# Patient Record
Sex: Female | Born: 1975
Health system: Southern US, Community
[De-identification: ages and names within clinical notes are randomized; demographics above are authoritative.]

## PROBLEM LIST (undated history)

## (undated) DIAGNOSIS — D259 Leiomyoma of uterus, unspecified: Secondary | ICD-10-CM

## (undated) DIAGNOSIS — K5900 Constipation, unspecified: Secondary | ICD-10-CM

## (undated) DIAGNOSIS — J45909 Unspecified asthma, uncomplicated: Secondary | ICD-10-CM

## (undated) DIAGNOSIS — E079 Disorder of thyroid, unspecified: Secondary | ICD-10-CM

## (undated) DIAGNOSIS — K2970 Gastritis, unspecified, without bleeding: Secondary | ICD-10-CM

## (undated) HISTORY — DX: Leiomyoma of uterus, unspecified: D25.9

## (undated) HISTORY — PX: BREAST EXCISIONAL BIOPSY: SUR124

## (undated) HISTORY — DX: Gastritis, unspecified, without bleeding: K29.70

## (undated) HISTORY — DX: Unspecified asthma, uncomplicated: J45.909

## (undated) HISTORY — DX: Disorder of thyroid, unspecified: E07.9

## (undated) HISTORY — PX: VEIN SURGERY: SHX48

## (undated) HISTORY — DX: Constipation, unspecified: K59.00

---

## 2006-07-14 HISTORY — PX: BREAST CYST ASPIRATION: SHX578

## 2010-07-14 DIAGNOSIS — D259 Leiomyoma of uterus, unspecified: Secondary | ICD-10-CM

## 2010-07-14 HISTORY — DX: Leiomyoma of uterus, unspecified: D25.9

## 2012-12-28 ENCOUNTER — Other Ambulatory Visit: Payer: Self-pay | Admitting: Obstetrics and Gynecology

## 2012-12-28 DIAGNOSIS — N6310 Unspecified lump in the right breast, unspecified quadrant: Secondary | ICD-10-CM

## 2012-12-28 DIAGNOSIS — Z803 Family history of malignant neoplasm of breast: Secondary | ICD-10-CM

## 2014-02-20 ENCOUNTER — Ambulatory Visit: Payer: Self-pay | Attending: Internal Medicine

## 2014-04-17 ENCOUNTER — Ambulatory Visit: Payer: Self-pay | Admitting: Family Medicine

## 2014-04-24 ENCOUNTER — Other Ambulatory Visit: Payer: Self-pay | Admitting: Family Medicine

## 2014-04-24 ENCOUNTER — Encounter: Payer: Self-pay | Admitting: Family Medicine

## 2014-04-24 ENCOUNTER — Ambulatory Visit: Payer: Self-pay | Attending: Family Medicine | Admitting: Family Medicine

## 2014-04-24 VITALS — BP 109/73 | HR 74 | Temp 97.9°F | Resp 16 | Ht 69.29 in | Wt 150.0 lb

## 2014-04-24 DIAGNOSIS — R7989 Other specified abnormal findings of blood chemistry: Secondary | ICD-10-CM

## 2014-04-24 DIAGNOSIS — R946 Abnormal results of thyroid function studies: Secondary | ICD-10-CM | POA: Insufficient documentation

## 2014-04-24 DIAGNOSIS — E0500 Thyrotoxicosis with diffuse goiter without thyrotoxic crisis or storm: Secondary | ICD-10-CM | POA: Insufficient documentation

## 2014-04-24 DIAGNOSIS — N951 Menopausal and female climacteric states: Secondary | ICD-10-CM

## 2014-04-24 DIAGNOSIS — Z720 Tobacco use: Secondary | ICD-10-CM

## 2014-04-24 DIAGNOSIS — N941 Dyspareunia: Secondary | ICD-10-CM | POA: Insufficient documentation

## 2014-04-24 DIAGNOSIS — F17200 Nicotine dependence, unspecified, uncomplicated: Secondary | ICD-10-CM | POA: Insufficient documentation

## 2014-04-24 DIAGNOSIS — N926 Irregular menstruation, unspecified: Secondary | ICD-10-CM

## 2014-04-24 DIAGNOSIS — F17210 Nicotine dependence, cigarettes, uncomplicated: Secondary | ICD-10-CM | POA: Insufficient documentation

## 2014-04-24 LAB — CBC
HCT: 37.4 % (ref 36.0–46.0)
Hemoglobin: 12.7 g/dL (ref 12.0–15.0)
MCH: 30.2 pg (ref 26.0–34.0)
MCHC: 34 g/dL (ref 30.0–36.0)
MCV: 89 fL (ref 78.0–100.0)
PLATELETS: 261 10*3/uL (ref 150–400)
RBC: 4.2 MIL/uL (ref 3.87–5.11)
RDW: 13 % (ref 11.5–15.5)
WBC: 6 10*3/uL (ref 4.0–10.5)

## 2014-04-24 LAB — COMPLETE METABOLIC PANEL WITH GFR
ALBUMIN: 4.1 g/dL (ref 3.5–5.2)
ALT: 11 U/L (ref 0–35)
AST: 13 U/L (ref 0–37)
Alkaline Phosphatase: 52 U/L (ref 39–117)
BUN: 11 mg/dL (ref 6–23)
CALCIUM: 9.1 mg/dL (ref 8.4–10.5)
CHLORIDE: 106 meq/L (ref 96–112)
CO2: 24 mEq/L (ref 19–32)
Creat: 0.47 mg/dL — ABNORMAL LOW (ref 0.50–1.10)
GFR, Est African American: >89 mL/min
GFR, Est Non African American: >89 mL/min
Glucose, Bld: 90 mg/dL (ref 70–99)
POTASSIUM: 4.1 meq/L (ref 3.5–5.3)
Sodium: 140 mEq/L (ref 135–145)
Total Bilirubin: 0.3 mg/dL (ref 0.2–1.2)
Total Protein: 6.1 g/dL (ref 6.0–8.3)

## 2014-04-24 LAB — POCT URINE PREGNANCY: PREG TEST UR: NEGATIVE

## 2014-04-24 LAB — TSH: TSH: <0.008 u[IU]/mL — ABNORMAL LOW (ref 0.350–4.500)

## 2014-04-24 NOTE — Progress Notes (Signed)
   Subjective:    Patient ID: Yvette Johnson, female    DOB: [birth year], 38 y.o.   MRN: [id number] CC: establish care, pain with intercourse, irregular menses  HPI 38 yo F presents to establish care and discuss the following:  1. Hot flashes: one year of hot flashes, palpitations. Worsening over past 6 months. Also with dizziness. Symptoms occur 2 weeks before her period. Symptoms improve after period. Periods are not heavy. Periods occur every 28-35 days. At max up to 40 days. Night sweats. Feeling both anxiety and depression related to the symptoms. Symptoms, especially irritability, are affecting home and work.   Soc hx; current smoker  Review of Systems As per HPI    Objective:   Physical Exam BP 109/73  Pulse 74  Temp(Src) 97.9 F (36.6 C) (Oral)  Resp 16  Ht 5' 9.29" (1.76 m)  Wt 150 lb (68.04 kg)  BMI 21.97 kg/m2  SpO2 100%  LMP 03/25/2014 General appearance: alert, cooperative and no distress Neck: no adenopathy, supple, symmetrical, trachea midline and thyroid not enlarged, symmetric, no tenderness/mass/nodules Lungs: clear to auscultation bilaterally Heart: regular rate and rhythm, S1, S2 normal, no murmur, click, rub or gallop Extremities: extremities normal, atraumatic, no cyanosis or edema and varicose veins noted   U preg negative      Assessment & Plan:

## 2014-04-24 NOTE — Progress Notes (Signed)
Establish Care Pt complaining of pain with intercourse. Stated menses is irregular.

## 2014-04-24 NOTE — Assessment & Plan Note (Addendum)
A; normal exam. Symptoms of early menopause P: Lab work, TSH, FSH/LH, CBC, CMP Plan for lab results review and pap at f/u visit.   Reviewed labs LH and FSH normal, low TSH suspect symptoms due to hyperthyroidism will obtain f/u free T4 and T3

## 2014-04-24 NOTE — Assessment & Plan Note (Signed)
A; not ready to quit P: smoking cessation recommended

## 2014-04-24 NOTE — Patient Instructions (Signed)
Yvette Johnson,  Thank you for coming in today. It was nice to meet you.   You will be called with lab results. Checking for early menopause.  F/u in 2-3 weeks to review labs and for physical with pap smear.  Dr. Adrian Blackwater   Nicotine chewing gum Qu es este medicamento? LA NICOTINA ayuda a los fumadores a dejar de fumar. Este medicamento reemplaza la nicotina de los cigarrillos y [place] a disminuir los efectos de la abstinencia. Es ms eficaz si se lo combina con un programa supervisado para dejar de fumar. Este medicamento puede ser utilizado para otros usos; si tiene alguna pregunta consulte con su proveedor de atencin mdica o con su farmacutico. MARCAS COMERCIALES DISPONIBLES: NICOrelief, Nicorette Qu le debo informar a mi profesional de la salud antes de tomar este medicamento? Necesita saber si usted presenta alguno de los [employer] o situaciones: -diabetes -enfermedad cardiaca, angina, latidos cardiacos irregulares o ataque cardiaco previo -enfermedad pulmonar incluyendo asma -hipertiroidismo -feocromocitoma -lceras o problemas estomacales -una reaccin alrgica o inusual a la nicotina, a otros medicamentos, alimentos, colorantes o conservantes -si est embarazada o buscando quedar embarazada -si est amamantando a un beb Cmo debo utilizar este medicamento? El chicle se debe masticar, pero no lo trague. Siga atentamente las indicaciones que acompaan al chicle. selo exactamente como se indica. Cuando sienta un deseo urgente de fumar, mastique un chicle lentamente. Contine masticando hasta que sienta el sabor del chicle o hasta que sienta un ligero hormigueo en la boca. En ese momento, deje de Health visitor y colquelo entre la mejilla y las encas. Espere hasta que el sabor del chicle o el hormigueo casi hayan desaparecido y comience a Engineer, manufacturing systems nuevamente. Contine masticando de esta manera durante aproximadamente 30 minutos. Masticar lentamente ayudar a  reducir las ansias y tambin ayudar a reducir la posibilidad de Best boy acidez de [place] u otros efectos secundarios gastrointestinales. Hable con su pediatra para informarse acerca del uso de este medicamento en nios. Puede requerir atencin especial. Sobredosis: Pngase en contacto inmediatamente con un centro toxicolgico o una sala de urgencia si usted cree que haya tomado demasiado medicamento. ATENCIN: [employer] es solo para usted. No comparta este medicamento con nadie. Qu sucede si me olvido de una dosis? No se aplica en este caso. Slo debe utilizar el chicle cuando sienta un fuerte deseo de fumar. No mastique ms de un chicle por vez. Qu puede interactuar con este medicamento? -medicamentos para el asma -medicamentos para la presin sangunea -medicamentos la depresin mental Puede ser que esta lista no menciona todas las posibles interacciones. Informe a su profesional de [employer]	[place] de [employer] productos a base de hierbas, medicamentos de [place] o suplementos nutritivos que est tomando. Si usted fuma, consume bebidas alcohlicas o si utiliza drogas ilegales, indqueselo tambin a su profesional de [employer]	[place]. Algunas sustancias pueden interactuar con su medicamento. A qu debo estar atento al usar [employer]? Lleve siempre los chicles de nicotina consigo. No utilice ms de 30 chicles por da. Su utilizacin excesiva puede aumentar el riesgo de una sobredosis. A medida que disminuya la necesidad de fumar, reduzca gradualmente la cantidad de chicles que mastica por da en el transcurso de un perodo de 2 a 3 meses. Cuando llegue a Engineer, manufacturing systems slo 1  2 chicles por da, deje de usar los chicles de nicotina. Usted debe comenzar a usar los chicles de nicotina el da que deje de fumar. Est bien si no tienes xito con su intento de dejar  de fumar y fumas un cigarrillo. Usted puede seguir su intento de abandono y seguir usando el producto segn las indicaciones. Simplemente  deseche sus cigarrillos y regrese a su plan de dejar de fumar. Si le duele la boca cuando mastica el chicle, chupe caramelos duros sin azcar entre chicle y chicle para Best boy. [place] sus dientes en forma peridica para reducir la irritacin de la boca. Si [place] dentadura postiza y Advice worker se le pega, consulte a su mdico o a su profesional de Technical sales engineer. Si usted es diabtico y deja de Copy, los efectos de la insulina pueden aumentar; por esta razn, puede resultar necesario que reduzca su dosis de insulina. Consulte con su mdico o con su profesional de la salud sobre cmo ajustar su dosis de [place]. Qu efectos secundarios puedo tener al [employer] este medicamento? Efectos secundarios que debe informar a su mdico o a Barrister's clerk de la salud tan pronto como sea posible: -Chief of Staff como erupcin cutnea, picazn o urticarias, hinchazn de la cara, labios o lengua -ampollas en la boca -problemas respiratorios -cambios de audicin -cambios en la visin -dolor en el pecho -sudoracin fra -confusin -pulso cardiaco rpido, irregular -sensacin de desmayos o aturdimiento, cadas -dolor de cabeza -mayor salivacin -nuseas, vmitos -dolor estomacal -debilidad Efectos secundarios que, por lo general, no requieren atencin mdica (debe informarlos a su mdico o a su profesional de la salud si persisten o si son molestos): -diarrea -boca seca -hipos -irritabilidad -inquietud o nerviosismo -dificultad para conciliar el sueo o sueos vvidos Puede ser que esta lista no menciona todos los posibles efectos secundarios. Comunquese a su mdico por asesoramiento mdico [employer]. Usted puede informar los efectos secundarios a la FDA por telfono al 1-800-FDA-1088. Dnde debo guardar mi medicina? Mantngala fuera del alcance de los nios. [place] a [employer], entre 15 y 83 grados C (59 y 42 grados F). Protjala del calor y la luz. Deseche  todo el medicamento que no haya utilizado, despus de la fecha de vencimiento. ATENCIN: Este folleto es un resumen. Puede ser que no cubra toda la posible informacin. Si usted tiene preguntas acerca de esta medicina, consulte con su mdico, su farmacutico o su profesional de Technical sales engineer.  2015, Elsevier/Gold Standard. (2013-08-16 15:37:48)

## 2014-04-25 ENCOUNTER — Telehealth: Payer: Self-pay | Admitting: *Deleted

## 2014-04-25 LAB — FSH/LH
FSH: 32.8 m[IU]/mL
LH: 48.3 m[IU]/mL

## 2014-04-25 LAB — VITAMIN D 25 HYDROXY (VIT D DEFICIENCY, FRACTURES): VIT D 25 HYDROXY: 49 ng/mL (ref 30–89)

## 2014-04-25 NOTE — Telephone Encounter (Signed)
Pt aware of lab results. Notified lab was added.       Notes Recorded by Minerva Ends, MD on 04/25/2014 at 4:34 PM Normal TSH and FSH Normal vitamin D Normal CBC and CMP. Low TSH concerning for hyperthyroidism, add on T3 and free T4 if able if not patient will have to come back for labs.

## 2014-04-26 LAB — T3, FREE: T3 FREE: 4.8 pg/mL — AB (ref 2.3–4.2)

## 2014-04-26 LAB — T4, FREE: FREE T4: 1.47 ng/dL (ref 0.80–1.80)

## 2014-04-26 NOTE — Assessment & Plan Note (Addendum)
Elevated free T3, normal free T4. F/u labs needed T3 and lipids.  I am unable to order TRAb (thyroid receptor antibody) to help figure out the cause of hyperthyroidism, so I placed a referral to endocrinology.  Patient should come back within 1 week for blood draw and schedule f/u with me in 2 weeks

## 2014-04-26 NOTE — Addendum Note (Signed)
Addended by: Boykin Nearing on: 04/26/2014 01:56 PM   Modules accepted: Orders

## 2014-04-28 ENCOUNTER — Telehealth: Payer: Self-pay | Admitting: *Deleted

## 2014-04-28 NOTE — Telephone Encounter (Deleted)
Message copied by Betti Cruz on Fri Apr 28, 2014 10:51 AM ------      Message from: Boykin Nearing      Created: Wed Apr 26, 2014  1:56 PM       Elevated free T3, normal free T4.      F/u labs needed T3 and lipids.       I am unable to order TRAb (thyroid receptor antibody) to help figure out the cause of hyperthyroidism, so I placed a referral to endocrinology.       Patient should come back within 1 week for blood draw and schedule f/u with me in 2 weeks ------

## 2014-04-28 NOTE — Telephone Encounter (Signed)
Pt aware of lab results, will make apt for next week

## 2014-04-28 NOTE — Telephone Encounter (Signed)
Message copied by Betti Cruz on Fri Apr 28, 2014 10:55 AM ------      Message from: Boykin Nearing      Created: Wed Apr 26, 2014  1:56 PM       Elevated free T3, normal free T4.      F/u labs needed T3 and lipids.       I am unable to order TRAb (thyroid receptor antibody) to help figure out the cause of hyperthyroidism, so I placed a referral to endocrinology.       Patient should come back within 1 week for blood draw and schedule f/u with me in 2 weeks ------

## 2014-05-03 ENCOUNTER — Ambulatory Visit: Payer: Self-pay | Attending: Family Medicine

## 2014-05-03 DIAGNOSIS — N951 Menopausal and female climacteric states: Secondary | ICD-10-CM | POA: Insufficient documentation

## 2014-05-03 DIAGNOSIS — R946 Abnormal results of thyroid function studies: Secondary | ICD-10-CM | POA: Insufficient documentation

## 2014-05-03 DIAGNOSIS — R7989 Other specified abnormal findings of blood chemistry: Secondary | ICD-10-CM

## 2014-05-03 LAB — LIPID PANEL
Cholesterol: 146 mg/dL (ref 0–200)
HDL: 50 mg/dL (ref >39)
LDL CALC: 76 mg/dL (ref 0–99)
Total CHOL/HDL Ratio: 2.9 Ratio
Triglycerides: 99 mg/dL (ref <150)
VLDL: 20 mg/dL (ref 0–40)

## 2014-05-03 LAB — T3
T3 TOTAL: 174.3 ng/dL (ref 80.0–204.0)
T3 TOTAL: 174.5 ng/dL (ref 80.0–204.0)

## 2014-05-03 LAB — T4, FREE: Free T4: 1.4 ng/dL (ref 0.80–1.80)

## 2014-05-08 ENCOUNTER — Ambulatory Visit: Payer: Self-pay | Attending: Family Medicine | Admitting: Family Medicine

## 2014-05-08 ENCOUNTER — Encounter: Payer: Self-pay | Admitting: Family Medicine

## 2014-05-08 ENCOUNTER — Other Ambulatory Visit (HOSPITAL_COMMUNITY)
Admission: RE | Admit: 2014-05-08 | Discharge: 2014-05-08 | Disposition: A | Discharge status: Home / Self Care | Payer: Self-pay | Source: Ambulatory Visit | Attending: Family Medicine | Admitting: Family Medicine

## 2014-05-08 VITALS — BP 96/60 | HR 70 | Temp 97.7°F | Resp 19 | Ht 69.0 in | Wt 154.0 lb

## 2014-05-08 DIAGNOSIS — E0590 Thyrotoxicosis, unspecified without thyrotoxic crisis or storm: Secondary | ICD-10-CM | POA: Insufficient documentation

## 2014-05-08 DIAGNOSIS — Z01419 Encounter for gynecological examination (general) (routine) without abnormal findings: Secondary | ICD-10-CM | POA: Insufficient documentation

## 2014-05-08 DIAGNOSIS — B373 Candidiasis of vulva and vagina: Secondary | ICD-10-CM | POA: Insufficient documentation

## 2014-05-08 DIAGNOSIS — Z124 Encounter for screening for malignant neoplasm of cervix: Secondary | ICD-10-CM

## 2014-05-08 DIAGNOSIS — Z1151 Encounter for screening for human papillomavirus (HPV): Secondary | ICD-10-CM | POA: Insufficient documentation

## 2014-05-08 DIAGNOSIS — Z113 Encounter for screening for infections with a predominantly sexual mode of transmission: Secondary | ICD-10-CM | POA: Insufficient documentation

## 2014-05-08 DIAGNOSIS — N760 Acute vaginitis: Secondary | ICD-10-CM | POA: Insufficient documentation

## 2014-05-08 DIAGNOSIS — B3731 Acute candidiasis of vulva and vagina: Secondary | ICD-10-CM

## 2014-05-08 DIAGNOSIS — F17210 Nicotine dependence, cigarettes, uncomplicated: Secondary | ICD-10-CM | POA: Insufficient documentation

## 2014-05-08 LAB — CERVICOVAGINAL ANCILLARY ONLY
WET PREP (BD AFFIRM): NEGATIVE
Wet Prep (BD Affirm): NEGATIVE
Wet Prep (BD Affirm): POSITIVE — AB

## 2014-05-08 NOTE — Assessment & Plan Note (Signed)
A: symptoms and exam stable P: Go forward with endocrinology visit.  ? If treatment would be recommended for subclinical hyperthyroidism with symptoms.

## 2014-05-08 NOTE — Progress Notes (Signed)
Annual Physical and pap smear

## 2014-05-08 NOTE — Assessment & Plan Note (Signed)
A: normal exam P: Screening pap, Gc.chlam, wet prep done today Patient advised to call in if she still has R pelvic pain

## 2014-05-08 NOTE — Progress Notes (Signed)
   Subjective:    Patient ID: Yvette Johnson, female    DOB: [birth year], 38 y.o.   MRN: [id number] CC: gynecological exam  HPI 38 yo F presents for f/u visit:  1. Pap smear: currently on menses. Having some cramps. Outside of menses does have some intermittent R pelvic pains that are moderate to severe. Non radiating. No associated fever or vaginal discharge. No pain currently.   2. Subclinical hyperthyroidism: low TSH. Normal free T4 and total T3. Patient still with intermittent hot flashes, palpitations. Irritable mood. Has appt with endocrinology next month.   Soc hx: current light smoker  Review of Systems As per HPI     Objective:   Physical Exam BP 96/60  Pulse 70  Temp(Src) 97.7 F (36.5 C) (Oral)  Resp 19  Ht 5\' 9"  (1.753 m)  Wt 154 lb (69.854 kg)  BMI 22.73 kg/m2  SpO2 98%  LMP 05/08/2014 General appearance: alert, cooperative and no distress Neck: thyroid not enlarged, symmetric, no tenderness/mass/nodules Pelvic: cervix normal in appearance, external genitalia normal, no adnexal masses or tenderness, no cervical motion tenderness, positive findings: vaginal discharge:  bloody, rectovaginal septum normal and uterus normal size, shape, and consistency      Assessment & Plan:

## 2014-05-08 NOTE — Patient Instructions (Signed)
Yvette Johnson,  Thank you for coming back today. You will be called with pap results.   Keep endocrinology referral to discuss subclinical hyperthyroidism with symptoms.   Call my office if you are still getting R pelvic pains after your periods.   Dr. Adrian Blackwater

## 2014-05-09 DIAGNOSIS — B373 Candidiasis of vulva and vagina: Secondary | ICD-10-CM | POA: Insufficient documentation

## 2014-05-09 DIAGNOSIS — B3731 Acute candidiasis of vulva and vagina: Secondary | ICD-10-CM | POA: Insufficient documentation

## 2014-05-09 LAB — CERVICOVAGINAL ANCILLARY ONLY
Chlamydia: NEGATIVE
NEISSERIA GONORRHEA: NEGATIVE

## 2014-05-09 LAB — CYTOLOGY - PAP

## 2014-05-09 MED ORDER — FLUCONAZOLE 150 MG PO TABS
150.0000 mg | ORAL_TABLET | ORAL | Status: DC
Start: 2014-05-09 — End: 2015-07-10

## 2014-05-09 NOTE — Addendum Note (Signed)
Addended by: Boykin Nearing on: 05/09/2014 09:36 AM   Modules accepted: Orders

## 2014-05-09 NOTE — Assessment & Plan Note (Signed)
Yeast on wet prep, sent in flagyl

## 2014-05-11 ENCOUNTER — Telehealth: Payer: Self-pay | Admitting: *Deleted

## 2014-05-11 NOTE — Telephone Encounter (Signed)
Message copied by Betti Cruz on Thu May 11, 2014 11:47 AM ------      Message from: Boykin Nearing      Created: Tue May 09, 2014  9:35 AM       Yeast on wet prep, sent in flagyl ------

## 2014-05-11 NOTE — Telephone Encounter (Signed)
Pt aware of lab, pap and wet prep results, will pick up Rx Flagyl tomorrow

## 2014-05-15 ENCOUNTER — Encounter: Payer: Self-pay | Admitting: Family Medicine

## 2014-05-22 ENCOUNTER — Ambulatory Visit: Payer: Self-pay | Attending: Family Medicine

## 2014-05-29 ENCOUNTER — Ambulatory Visit: Payer: Self-pay | Admitting: Internal Medicine

## 2014-06-15 ENCOUNTER — Ambulatory Visit: Payer: Self-pay | Admitting: Internal Medicine

## 2014-06-30 ENCOUNTER — Ambulatory Visit (INDEPENDENT_AMBULATORY_CARE_PROVIDER_SITE_OTHER): Payer: Self-pay | Admitting: Internal Medicine

## 2014-06-30 ENCOUNTER — Encounter: Payer: Self-pay | Admitting: Internal Medicine

## 2014-06-30 VITALS — BP 110/58 | HR 81 | Temp 98.5°F | Resp 12 | Ht 65.5 in | Wt 152.0 lb

## 2014-06-30 DIAGNOSIS — E0590 Thyrotoxicosis, unspecified without thyrotoxic crisis or storm: Secondary | ICD-10-CM

## 2014-06-30 NOTE — Progress Notes (Addendum)
Patient ID: Yvette Johnson, female   DOB: 05/04/1976, 38 y.o.   MRN: [id number]   HPI  Yvette Johnson is a 38 y.o.-year-old female, referred by her PCP, Dr. Adrian Blackwater, for evaluation for thyrotoxicosis.  She presented to see her PCP for high heart rate and pain in pelvis. TFTs were found to be abnormal:  I reviewed pt's thyroid tests: Lab Results  Component Value Date   TSH <0.008* 04/24/2014   FREET4 1.40 05/03/2014   FREET4 1.47 04/24/2014    Pt denies feeling nodules in neck, hoarseness, + dysphagia last month, not anymore, occasional choking/no odynophagia, no SOB with lying down.  She c/o the following sx for the last year: - + fatigue - + weight loss (intentional - lost 4 lbs in 2 mo) - no excessive sweating/heat intolerance - no tremors - + anxiety (new) - + palpitations - + rapid heart beat - + dizziness - no hyperdefecation  Pt does have a FH of thyroid ds: mother, aunts (hyper- and hypo-thyroidism). + FH of thyroid cancer in aunt. No h/o radiation tx to head or neck.  No seaweed or kelp, no recent contrast studies. No steroid use. No herbal supplements.   No other PMH, other than ovarian and vaginal pain.  ROS: Constitutional: + weight gain/loss, + fatigue, + hot flushes/cold intol., + poor sleep, + nocturia Eyes: + blurry vision, no xerophthalmia ENT: see HPI, + tinnitus, + hypoacusis Cardiovascular: + CP/+ SOB/+ palpitations/+ leg swelling Respiratory:+ cough/+ SOB Gastrointestinal: no N/V/D/C, + heartburn Musculoskeletal: no muscle aches/+ joint aches Skin: no rashes, + itching, + easy bruising, + hair loss Neurological: no tremors/numbness/tingling/dizziness, + HA Psychiatric: + both: depression/anxiety   Past Medical History  Diagnosis Date  . Uterine fibroid 2012    Past Surgical History  Procedure Laterality Date  . Cesarean section  2010   . Breast cyst aspiration Right 2008    History   Social History  . Marital Status: Married    Spouse Name:  N/A    Number of Children: 1  . Years of Education: 12    Occupational History  . Hair Stylist     Social History Main Topics  . Smoking status: Current Every Day Smoker - now 3 cigs a day-- 0.25 packs/day for 23 years    Types: Cigarettes  . Smokeless tobacco: Never Used  . Alcohol Use: No  . Drug Use: No  . Sexual Activity: Yes    Birth Control/ Protection: None   Social History Narrative   Lives with husband, daughter and mother.    Current Outpatient Prescriptions on File Prior to Visit  Medication Sig Dispense Refill  . Soy Isoflavone 100 MG CAPS Take by mouth.    . fluconazole (DIFLUCAN) 150 MG tablet Take 1 tablet (150 mg total) by mouth every 3 (three) days. (Patient not taking: Reported on 06/30/2014) 2 tablet 0   No current facility-administered medications on file prior to visit.   Allergies  Allergen Reactions  . Antihistamines, Diphenhydramine-Type Shortness Of Breath  . Penicillins Nausea And Vomiting  . Vicks Dayquil Cold & Flu [Dm-Phenylephrine-Acetaminophen] Shortness Of Breath   Family History  Problem Relation Age of Onset  . Cancer Mother 44    breast cancer, 2009   . Asthma Father   . Heart disease Father 51  . Diabetes Neg Hx    PE: BP 110/58 mmHg  Pulse 81  Temp(Src) 98.5 F (36.9 C) (Oral)  Resp 12  Ht 5' 5.5" (1.664 m)  [facility]  152 lb (68.947 kg)  BMI 24.90 kg/m2  SpO2 97% Wt Readings from Last 3 Encounters:  06/30/14 152 lb (68.947 kg)  05/08/14 154 lb (69.854 kg)  04/24/14 150 lb (68.04 kg)   Constitutional: normal weight, in NAD Eyes: PERRLA, EOMI, no exophthalmos, no lid lag, no stare ENT: moist mucous membranes, no thyromegaly, no thyroid bruits, no cervical lymphadenopathy Cardiovascular: RRR, No MRG Respiratory: CTA B Gastrointestinal: abdomen soft, NT, ND, BS+ Musculoskeletal: no deformities, strength intact in all 4 Skin: moist, warm, no rashes Neurological: no tremor with outstretched hands, DTR normal in all  4  ASSESSMENT: 1. Thyrotoxicosis  PLAN:  1. Patient with a recently found low TSH, but with some thyrotoxic sxs: mild weight loss, palpitations, anxiety, for the last year - she does not appear to have exogenous causes for the low TSH.  - We discussed that possible causes of thyrotoxicosis are:  Graves ds   Thyroiditis toxic multinodular goiter/ toxic adenoma (I cannot feel nodules at palpation of her thyroid). - I suggested that we check the TSH, fT3 and fT4 and also had thyroid stimulating antibodies to screen for Graves' disease.  - If the tests remain abnormal, ideally, we would check an uptake and scan to differentiate between the 3 above possible etiologies, however, she does not have insurance and this can be quite costly. Will try to bypass the test, if possible. My suspicion is for mild Graves ds. due to the protracted course. - we discussed about possible modalities of treatment for the above conditions, to include methimazole use, radioactive iodine ablation or (last resort) surgery. Discussed plusses and minuses of each. Again, RAI tx would be costly, so we need to try to stay with MMI if possible. Also, she has a 75 y/o child so RAI tx would be difficult. - we discussed about possible SEs to [place] - I advised her not to get pregnant until we get TFTs under ctrl. She is not planning for a pregnancy. - I do not feel that we need to add beta blockers at this time, since she is not tachycardic, anxious, or tremulous - we will need to call her through the Spanish interpreter line - RTC in 3 months, but likely sooner for repeat labs  Component     Latest Ref Rng 07/03/2014  T3, Free     2.3 - 4.2 pg/mL 4.4 (H)  Free T4     0.80 - 1.80 ng/dL 1.70  TSH     0.350 - 4.500 uIU/mL <0.008 (L)    TFTs same as before >> start low dose MMI 5 mg bid and return for TFTs in 1 month.  Component     Latest Ref Rng 06/30/2014  TSI     <140 % baseline 158 (H)  TSI Ab's slightly high >> likely  Graves ds., as suspected.

## 2014-06-30 NOTE — Patient Instructions (Signed)
Please stop at the lab.  I will let you if we need to start methimazole.  Please stop the Methimazole (Tapazole) and call us or your primary care doctor if you develop: - sore throat - fever - yellow skin - dark urine - light colored stools As we will then need to check your blood counts and liver tests.  Please come back for a follow-up appointment in 3 months.  Hipertiroidismo (Hyperthyroidism) La tiroides es una glndula grande ubicada en la parte anterior e inferior del cuello. La tiroides interviene [employer] control del metabolismo. El metabolismo es el modo en que el organismo utiliza los alimentos. El control del metabolismo se realiza a travs de una hormona denominada tiroxina. Cuando la tiroides es hiperactiva, produce demasiada hormona. Cuando esto ocurre pueden surgir los siguientes problemas:   Nerviosismo  Intolerancia al calor  Prdida de peso (a pesar del aumento de la ingesta de comida)  Diarrea  Cambios en la textura del cabello o de la piel  Palpitaciones (falta de algunos latidos cardacos o latidos extra)  Taquicardia (frecuencia cardaca acelerada)  Falta de menstruacin (amenorrea)  Temblor en las manos CAUSAS  Enfermedad de Graves (el sistema inmune ataca a la glndula tiroides). sta es la causa ms frecuente.  Inflamacin de la glndula tiroides.  Tumor (generalmente benigno) de la glndula tiroides o Dispensing optician.  Uso excesivo de medicamentos para la tiroides (tanto prescriptos como 'naturales')  Ingesta excesiva de iodo. DIAGNSTICO Para confirmar el hipertiroidismo, el profesional que lo asiste le solicitar anlisis de sangre y estudios con [place]. Algunas veces los signos estn ocultos. Puede ser [employer] profesional controle la enfermedad con exmenes de [place], ya sea antes o despus del diagnstico y [place]. TRATAMIENTO Tratamiento a Estate agent varios tratamientos para Emergency planning/management officer. Los  medicamentos beta bloqueantes podrn proporcionar cierto alivio. Los medicamentos que disminuyen la produccin de hormonas proporcionarn a Emergency planning/management officer temporal Estas medidas en general no ofrecen Environmental education officer. Tratamiento definitivo  Se dispone de varios tratamientos que el profesional que lo asiste podr Teacher, English as a foreign language con usted y que tratarn el problema de [place]. Estos tratamientos varan desde la ciruga (extirpacin de la tiroides) o el uso de yodo radiactivo (que destruye la tiroides por radiacin), hasta el uso de medicamentos antitiroideos (que interfieren en la sntesis de la hormona). Los dos primeros tratamientos son permanentes y Psychologist, clinical. Habitualmente requieren el suministro de hormona de por vida. Esto es debido a que es imposible retirar o destruir la cantidad Engineer, petroleum de tiroides que se necesita para que la persona quede eutiroidea (normal). INSTRUCCIONES PARA EL CUIDADO DOMICILIARIO Consulte con el profesional que lo asiste si el problema por el que lo trata empeora. Algunos ejemplos seran los trastornos ya mencionados. SOLICITE ATENCIN MDICA SI: El trastorno general empeora. EST SEGURO QUE:   Comprende las instrucciones para el alta mdica.  Controlar su enfermedad.  Solicitar atencin mdica de inmediato segn las indicaciones. Document Released: 06/30/2005 Document Revised: 09/22/2011 [facility] Patient Information 2015 [place]. This information is not intended to replace advice given to you by your health care provider. Make sure you discuss any questions you have with your health care provider.

## 2014-07-03 ENCOUNTER — Other Ambulatory Visit: Payer: Self-pay

## 2014-07-03 DIAGNOSIS — E079 Disorder of thyroid, unspecified: Secondary | ICD-10-CM

## 2014-07-04 LAB — T4, FREE: Free T4: 1.7 ng/dL (ref 0.80–1.80)

## 2014-07-04 LAB — TSH: TSH: <0.008 u[IU]/mL — ABNORMAL LOW (ref 0.350–4.500)

## 2014-07-04 LAB — T3, FREE: T3 FREE: 4.4 pg/mL — AB (ref 2.3–4.2)

## 2014-07-05 LAB — THYROID STIMULATING IMMUNOGLOBULIN: TSI: 158 %{baseline} — AB (ref <140)

## 2014-07-05 MED ORDER — METHIMAZOLE 5 MG PO TABS: 5.0000 mg | ORAL_TABLET | Freq: Two times a day (BID) | ORAL | Status: DC

## 2014-07-12 ENCOUNTER — Encounter: Payer: Self-pay | Admitting: *Deleted

## 2014-09-27 ENCOUNTER — Encounter: Payer: Self-pay | Admitting: Internal Medicine

## 2014-09-27 ENCOUNTER — Ambulatory Visit (INDEPENDENT_AMBULATORY_CARE_PROVIDER_SITE_OTHER): Payer: Self-pay | Admitting: Internal Medicine

## 2014-09-27 VITALS — BP 122/62 | HR 81 | Temp 97.8°F | Resp 12 | Wt 159.0 lb

## 2014-09-27 DIAGNOSIS — E0500 Thyrotoxicosis with diffuse goiter without thyrotoxic crisis or storm: Secondary | ICD-10-CM

## 2014-09-27 DIAGNOSIS — E0590 Thyrotoxicosis, unspecified without thyrotoxic crisis or storm: Secondary | ICD-10-CM

## 2014-09-27 LAB — TSH: TSH: 1 u[IU]/mL (ref 0.35–4.50)

## 2014-09-27 LAB — T4, FREE: Free T4: 0.54 ng/dL — ABNORMAL LOW (ref 0.60–1.60)

## 2014-09-27 LAB — T3, FREE: T3 FREE: 2.8 pg/mL (ref 2.3–4.2)

## 2014-09-27 NOTE — Progress Notes (Signed)
Patient ID: Yvette Johnson, female   DOB: December 19, 1975, 39 y.o.   MRN: [id number]   HPI  Yvette Johnson is a 39 y.o.-year-old female, returning for f/u for Graves ds. Last visit 3 months ago. She is here accompanied by the [place] interpreter.  Reviewed hx: She presented to see her PCP for high heart rate and pain in pelvis. TFTs were found to be abnormal. Repeated thyroid tests to month later, where again abnormal.  I reviewed pt's thyroid tests: Lab Results  Component Value Date   TSH <0.008* 07/03/2014   TSH <0.008* 04/24/2014   FREET4 1.70 07/03/2014   FREET4 1.40 05/03/2014   FREET4 1.47 04/24/2014    She had high Graves antibodies: Component     Latest Ref Rng 06/30/2014  TSI     <140 % baseline 158 (H)   We suspected Graves ds. based on the presentation and the high thyroid antibodies, however we did not perform a thyroid uptake and scan, since cost is an issue for her.  We started MMI 5 mg 2x a day, but she had HAs and weight gain/bloating >> dropped the dose to 5 mg daily. She is feeling well on this dose, but she still complains of weight gain.  Pt denies feeling nodules in neck, + hoarseness, no dysphagia, no choking/no odynophagia, no SOB with lying down.  She c/o the following sx: - + HAs >> now resolved >> but she decreased the MMI 5 mg in am  - + fatigue - + weight loss (intentional - lost 4 lbs in 2 mo) - no excessive sweating/heat intolerance - no tremors - + still anxiety  - + palpitations - + rapid heart beat - no hyperdefecation  She is trying to stop smoking.   Pt does have a FH of thyroid ds: mother, aunts (hyper- and hypo-thyroidism). + FH of thyroid cancer in aunt. No h/o radiation tx to head or neck.  ROS: Constitutional: See history of present illness Eyes: No blurry vision, no xerophthalmia ENT: + Hoarseness, + sore throat, + tinnitus, + hypoacusis Cardiovascular: + CP/+ SOB/no palpitations/+ leg swelling Respiratory:+ cough/+  SOB Gastrointestinal: no N/V/D/C/ heartburn Musculoskeletal: no muscle aches/+ joint aches Skin: + rashes, + itching, no easy bruising, no hair loss Neurological: no tremors/numbness/tingling/dizziness, + HA  I reviewed pt's medications, allergies, PMH, social hx, family hx, and changes were documented in the history of present illness. Otherwise, unchanged from my initial visit note.  Past Medical History  Diagnosis Date  . Uterine fibroid 2012    Past Surgical History  Procedure Laterality Date  . Cesarean section  2010   . Breast cyst aspiration Right 2008    History   Social History  . Marital Status: Married    Spouse Name: N/A    Number of Children: 1  . Years of Education: 12    Occupational History  . Hair Stylist     Social History Main Topics  . Smoking status: Current Every Day Smoker - now 3 cigs a day-- 0.25 packs/day for 23 years    Types: Cigarettes  . Smokeless tobacco: Never Used  . Alcohol Use: No  . Drug Use: No  . Sexual Activity: Yes    Birth Control/ Protection: None   Social History Narrative   Lives with husband, daughter and mother.    Current Outpatient Prescriptions on File Prior to Visit  Medication Sig Dispense Refill  . Ascorbic Acid (VITAMIN C) 100 MG tablet Take 100 mg by mouth daily.    Marland Kitchen  Cholecalciferol (VITAMIN D-3 PO) Take 1 capsule by mouth 2 (two) times daily.    . methimazole (TAPAZOLE) 5 MG tablet Take 1 tablet (5 mg total) by mouth 2 (two) times daily. 60 tablet 2  . fluconazole (DIFLUCAN) 150 MG tablet Take 1 tablet (150 mg total) by mouth every 3 (three) days. (Patient not taking: Reported on 06/30/2014) 2 tablet 0  . Multiple Vitamin (MULTIVITAMIN) tablet Take 1 tablet by mouth daily.    . Soy Isoflavone 100 MG CAPS Take by mouth.    . thiamine (VITAMIN B-1) 100 MG tablet Take 100 mg by mouth daily.     No current facility-administered medications on file prior to visit.   Allergies  Allergen Reactions  .  Antihistamines, Diphenhydramine-Type Shortness Of Breath  . Penicillins Nausea And Vomiting  . Vicks Dayquil Cold & Flu [Dm-Phenylephrine-Acetaminophen] Shortness Of Breath   Family History  Problem Relation Age of Onset  . Cancer Mother 43    breast cancer, 2009   . Asthma Father   . Heart disease Father 10  . Diabetes Neg Hx    PE: BP 122/62 mmHg  Pulse 81  Temp(Src) 97.8 F (36.6 C) (Oral)  Resp 12  Wt 159 lb (72.122 kg)  SpO2 97%  LMP 09/27/2014 Wt Readings from Last 3 Encounters:  09/27/14 159 lb (72.122 kg)  06/30/14 152 lb (68.947 kg)  05/08/14 154 lb (69.854 kg)   Constitutional: normal weight, in NAD Eyes: PERRLA, EOMI, no exophthalmos, no lid lag, no stare ENT: moist mucous membranes, no thyromegaly, no cervical lymphadenopathy Cardiovascular: RRR, No MRG Respiratory: CTA B Gastrointestinal: abdomen soft, NT, ND, BS+ Musculoskeletal: no deformities, strength intact in all 4 Skin: moist, warm, no rashes Neurological: no tremor with outstretched hands, DTR normal in all 4  ASSESSMENT: 1. Graves ds.  PLAN:  1. Patient with a relatively recently found low TSH, confirmed 2 month later, with thyrotoxic sxs: mild weight loss, palpitations, anxiety. Now the symptoms improved on methimazole. - At last visit, we suspected Graves (due to presentation and increased TSI antibodies), so we decided to bypass a thyroid uptake and scan due to finances. - We started methimazole 5 mg twice a day, however she developed headaches and bloating with the twice daily dose and she decreased the dose to once a day and the morning. She feels much better in general, with the exception of weight gain. - we  again discussed about possible modalities of treatment for the above conditions, to include methimazole use, radioactive iodine ablation or (last resort) surgery. Again discussed plusses and minuses of each. RAI tx would be costly and not preferred, since she has a 62 y/o child. She opted to  continue with methimazole and will try to adjust the dose down, to off, depending on the tests - we'll check her TSH, free T4, free T3 today - she knows not to get pregnant until we get her thyroid test under control - I do not feel that we need to add beta blockers at this time, since she is not tachycardic, anxious, or tremulous - we will need to call her through the Spanish interpreter line - RTC in 4 months, but likely sooner for repeat labs  Office Visit on 09/27/2014  Component Date Value Ref Range Status  . T3, Free 09/27/2014 2.8  2.3 - 4.2 pg/mL Final  . Free T4 09/27/2014 0.54* 0.60 - 1.60 ng/dL Final  . TSH 09/27/2014 1.00  0.35 - 4.50 uIU/mL Final   Thyroid  tests have normalized, with the exception of free T4 which is even a little bit low. I will try to decrease her methimazole to half a tablet daily and repeat the tests in 5-6 weeks.

## 2014-09-27 NOTE — Patient Instructions (Signed)
Please stop at the lab.  Please come back for a follow-up appointment in 4 months.   

## 2014-09-28 ENCOUNTER — Encounter: Payer: Self-pay | Admitting: Internal Medicine

## 2014-09-28 MED ORDER — METHIMAZOLE 5 MG PO TABS: 2.5000 mg | ORAL_TABLET | Freq: Every day | ORAL | Status: DC

## 2014-10-03 ENCOUNTER — Encounter: Payer: Self-pay | Admitting: *Deleted

## 2014-10-06 ENCOUNTER — Other Ambulatory Visit (HOSPITAL_COMMUNITY): Payer: Self-pay | Admitting: *Deleted

## 2014-10-06 ENCOUNTER — Encounter (HOSPITAL_COMMUNITY): Payer: Self-pay | Admitting: *Deleted

## 2014-10-06 DIAGNOSIS — N6310 Unspecified lump in the right breast, unspecified quadrant: Secondary | ICD-10-CM

## 2014-10-15 ENCOUNTER — Telehealth: Payer: Self-pay | Admitting: Family Medicine

## 2014-10-15 NOTE — Telephone Encounter (Signed)
Junious Dresser, please call patient to decrease methimazole to 1/2 tab (2.5 mg) once daily per her endocrinologist for low free T4.  She will need to go to her endocrinologist for f/u labs between 4/20-4/27 (5-6 weeks from last check). Thank you.

## 2014-10-16 NOTE — Telephone Encounter (Signed)
Pt aware (inforamtion given in Spanish)

## 2014-10-26 ENCOUNTER — Ambulatory Visit
Admission: RE | Admit: 2014-10-26 | Discharge: 2014-10-26 | Disposition: A | Discharge status: Home / Self Care | Payer: No Typology Code available for payment source | Source: Ambulatory Visit | Attending: Obstetrics and Gynecology | Admitting: Obstetrics and Gynecology

## 2014-10-26 ENCOUNTER — Other Ambulatory Visit (HOSPITAL_COMMUNITY): Payer: Self-pay | Admitting: Obstetrics and Gynecology

## 2014-10-26 ENCOUNTER — Ambulatory Visit (HOSPITAL_COMMUNITY)
Admission: RE | Admit: 2014-10-26 | Discharge: 2014-10-26 | Disposition: A | Discharge status: Home / Self Care | Payer: Self-pay | Source: Ambulatory Visit | Attending: Obstetrics and Gynecology | Admitting: Obstetrics and Gynecology

## 2014-10-26 ENCOUNTER — Encounter (HOSPITAL_COMMUNITY): Payer: Self-pay

## 2014-10-26 VITALS — BP 100/62 | Temp 98.1°F | Ht 62.0 in | Wt 159.0 lb

## 2014-10-26 DIAGNOSIS — Z1239 Encounter for other screening for malignant neoplasm of breast: Secondary | ICD-10-CM

## 2014-10-26 DIAGNOSIS — N6311 Unspecified lump in the right breast, upper outer quadrant: Secondary | ICD-10-CM

## 2014-10-26 DIAGNOSIS — N6310 Unspecified lump in the right breast, unspecified quadrant: Secondary | ICD-10-CM

## 2014-10-26 NOTE — Patient Instructions (Signed)
Education materials on self-breast awareness given. Explained to Yvette Johnson that she did not need a Pap smear today due to last Pap smear was 05/08/2014. Let her know [facility] will cover Pap smears every 3 years unless has a history of abnormal Pap smears. Referred patient to the [facility] for diagnostic mammogram and possible right breast ultrasound. Appointment scheduled for Thursday, October 26, 2014 at 1300. Patient aware of appointment and will be there. Yvette Johnson verbalized understanding.  [place]ann[place], Arvil Chaco, RN 1:08 PM

## 2014-10-26 NOTE — Progress Notes (Addendum)
Complaints of right breast lump x 3 weeks that is tender.   Pap Smear:  Pap smear not completed today. Last Pap smear was 05/08/2014 at [facility] and normal with negative HPV. Per patient has no history of an abnormal Pap smear. Last Pap smear result is in EPIC.  Physical exam: Breasts Breasts symmetrical. No skin abnormalities bilateral breasts. No nipple retraction bilateral breasts. No nipple discharge bilateral breasts. No lymphadenopathy. No lumps palpated left breast. Palpated a pea sized lump within the right breast at 11 o'clock above the areola. Complaints of tenderness when palpated lump. Per patient has a history of right breast surgery in 2005 to remove a cyst. Referred patient to the [facility] for diagnostic mammogram and possible right breast ultrasound. Appointment scheduled for Thursday, October 26, 2014 at 1300.   Pelvic/Bimanual No Pap smear completed today since last Pap smear was 05/08/2014. Pap smear not indicated per [facility] guidelines.   Used interpreter Benjamine Sprague.

## 2014-10-26 NOTE — Addendum Note (Signed)
Encounter addended by: Loletta Parish, RN on: 10/26/2014  1:15 PM<BR>     Documentation filed: Notes Section

## 2014-10-27 ENCOUNTER — Other Ambulatory Visit (HOSPITAL_COMMUNITY): Payer: Self-pay | Admitting: Obstetrics and Gynecology

## 2014-10-27 DIAGNOSIS — N6310 Unspecified lump in the right breast, unspecified quadrant: Secondary | ICD-10-CM

## 2014-11-02 ENCOUNTER — Ambulatory Visit
Admission: RE | Admit: 2014-11-02 | Discharge: 2014-11-02 | Disposition: A | Discharge status: Home / Self Care | Payer: Self-pay | Source: Ambulatory Visit | Attending: Obstetrics and Gynecology | Admitting: Obstetrics and Gynecology

## 2014-11-02 DIAGNOSIS — N6310 Unspecified lump in the right breast, unspecified quadrant: Secondary | ICD-10-CM

## 2014-11-15 ENCOUNTER — Ambulatory Visit: Payer: Self-pay | Attending: Family Medicine

## 2015-01-30 ENCOUNTER — Ambulatory Visit (INDEPENDENT_AMBULATORY_CARE_PROVIDER_SITE_OTHER): Payer: Self-pay | Admitting: Internal Medicine

## 2015-01-30 ENCOUNTER — Encounter: Payer: Self-pay | Admitting: Internal Medicine

## 2015-01-30 VITALS — BP 102/60 | HR 68 | Temp 97.9°F | Resp 12 | Wt 154.8 lb

## 2015-01-30 DIAGNOSIS — E0500 Thyrotoxicosis with diffuse goiter without thyrotoxic crisis or storm: Secondary | ICD-10-CM

## 2015-01-30 LAB — T4, FREE: FREE T4: 0.76 ng/dL (ref 0.60–1.60)

## 2015-01-30 LAB — T3, FREE: T3, Free: 3 pg/mL (ref 2.3–4.2)

## 2015-01-30 LAB — TSH: TSH: 2.69 u[IU]/mL (ref 0.35–4.50)

## 2015-01-30 MED ORDER — METHIMAZOLE 5 MG PO TABS: 2.5000 mg | ORAL_TABLET | Freq: Every day | ORAL | Status: DC

## 2015-01-30 NOTE — Progress Notes (Signed)
Patient ID: Yvette Johnson, female   DOB: 31-Aug-1975, 39 y.o.   MRN: [id number]   HPI  Yvette Johnson is a 39 y.o.-year-old female, returning for f/u for Graves ds. Last visit 3 months ago. She is here accompanied by the [place] interpreter.  Reviewed hx: She presented to see her PCP for high heart rate and pain in pelvis. TFTs were found to be abnormal. Repeated thyroid tests to month later, where again abnormal.  I reviewed pt's thyroid tests: Lab Results  Component Value Date   TSH 1.00 09/27/2014   TSH <0.008* 07/03/2014   TSH <0.008* 04/24/2014   FREET4 0.54* 09/27/2014   FREET4 1.70 07/03/2014   FREET4 1.40 05/03/2014   FREET4 1.47 04/24/2014    She had high Graves antibodies: Component     Latest Ref Rng 06/30/2014  TSI     <140 % baseline 158 (H)   We suspected Graves ds. based on the presentation and the high thyroid antibodies, however we did not perform a thyroid uptake and scan, since cost is an issue for her.  We started MMI 5 mg 2x a day, but she had HAs and weight gain/bloating >> dropped the dose to 5 mg daily. As TFTs improved >> we decreased the MMI to 2.5 mg daily in 09/2014, however, despite calls and sending her a letter, she continued with 2.5 mg bid!  Pt denies feeling nodules in neck, no hoarseness, no dysphagia, no choking/no odynophagia, no SOB with lying down.  She c/o the following sx: - much less HAs - less fatigue - + continued weight loss - 4 lbs - intentional - diet - + fatigue - no excessive sweating/heat intolerance - no tremors - much less anxiety - 2 weeks before menses - much less palpitations - no rapid heart beat - no hyperdefecation  Pt does have a FH of thyroid ds: mother, aunts (hyper- and hypo-thyroidism). + FH of thyroid cancer in aunt. No h/o radiation tx to head or neck.  ROS: Constitutional: see HPI Eyes: No blurry vision, no xerophthalmia ENT: no Hoarseness, no  sore throat, no tinnitus, no hypoacusis Cardiovascular: no  CP/SOB/no palpitations/+ leg swelling Respiratory:no cough/SOB Gastrointestinal: no N/V/D/C/ heartburn Musculoskeletal: no muscle aches/joint aches Skin: no rashes, itching, no easy bruising, no hair loss Neurological: no tremors/numbness/tingling/dizziness, + HA  I reviewed pt's medications, allergies, PMH, social hx, family hx, and changes were documented in the history of present illness. Otherwise, unchanged from my initial visit note:  Past Medical History  Diagnosis Date  . Uterine fibroid 2012    Past Surgical History  Procedure Laterality Date  . Cesarean section  2010   . Breast cyst aspiration Right 2008    History   Social History  . Marital Status: Married    Spouse Name: N/A    Number of Children: 1  . Years of Education: 12    Occupational History  . Hair Stylist     Social History Main Topics  . Smoking status: Current Every Day Smoker - now 3 cigs a day-- 0.25 packs/day for 23 years    Types: Cigarettes  . Smokeless tobacco: Never Used  . Alcohol Use: No  . Drug Use: No  . Sexual Activity: Yes    Birth Control/ Protection: None   Social History Narrative   Lives with husband, daughter and mother.    Current Outpatient Prescriptions on File Prior to Visit  Medication Sig Dispense Refill  . Ascorbic Acid (VITAMIN C) 100 MG tablet Take  100 mg by mouth daily.    . Cholecalciferol (VITAMIN D-3 PO) Take 1 capsule by mouth 2 (two) times daily.    . methimazole (TAPAZOLE) 5 MG tablet Take 0.5 tablets (2.5 mg total) by mouth daily. (Patient taking differently: Take 2.5 mg by mouth 2 (two) times daily. ) 30 tablet 2  . Multiple Vitamin (MULTIVITAMIN) tablet Take 1 tablet by mouth daily.    . Soy Isoflavone 100 MG CAPS Take by mouth.    . thiamine (VITAMIN B-1) 100 MG tablet Take 100 mg by mouth daily.    . fluconazole (DIFLUCAN) 150 MG tablet Take 1 tablet (150 mg total) by mouth every 3 (three) days. (Patient not taking: Reported on 06/30/2014) 2 tablet 0    No current facility-administered medications on file prior to visit.   Allergies  Allergen Reactions  . Antihistamines, Diphenhydramine-Type Shortness Of Breath  . Penicillins Nausea And Vomiting  . Vicks Dayquil Cold & Flu [Dm-Phenylephrine-Acetaminophen] Shortness Of Breath   Family History  Problem Relation Age of Onset  . Cancer Mother 82    breast cancer, 2009   . Asthma Father   . Heart disease Father 65  . Diabetes Neg Hx    PE: BP 102/60 mmHg  Pulse 68  Temp(Src) 97.9 F (36.6 C) (Oral)  Resp 12  Wt 154 lb 12.8 oz (70.217 kg)  SpO2 98% Body mass index is 28.31 kg/(m^2). Wt Readings from Last 3 Encounters:  01/30/15 154 lb 12.8 oz (70.217 kg)  10/26/14 159 lb (72.122 kg)  09/27/14 159 lb (72.122 kg)   Constitutional: normal weight, in NAD Eyes: PERRLA, EOMI, no exophthalmos, no lid lag, no stare ENT: moist mucous membranes, no thyromegaly, no cervical lymphadenopathy Cardiovascular: RRR, No MRG Respiratory: CTA B Gastrointestinal: abdomen soft, NT, ND, BS+ Musculoskeletal: no deformities, strength intact in all 4 Skin: moist, warm, no rashes Neurological: no tremor with outstretched hands, DTR normal in all 4  ASSESSMENT: 1. Graves ds.  PLAN:  1. Patient with Graves ds., with thyrotoxic sxs: weight loss, palpitations, anxiety, now all improved a lot on methimazole. - we suspected Graves (due to presentation and increased TSI antibodies), so we decided to bypass a thyroid uptake and scan due to finances.  - We started methimazole 5 mg twice a day, however she developed headaches and bloating with the twice daily dose and she decreased the dose to once a day and the morning. At last visit, after results came back normal,  advised her to decrease the dose further to 2.5 mg daily, but she did not get the message... She is still on 5 mg a day.  - we'll check her TSH, free T4, free T3 today - she knows not to get pregnant until we get her thyroid test under  control - I do not feel that we need to add beta blockers at this time, since she is not tachycardic, anxious, or tremulous - we will need to call her through the Spanish interpreter line - call her directly at [phone]. - RTC in 6 months, but likely sooner for repeat labs  Needs refills.  Office Visit on 01/30/2015  Component Date Value Ref Range Status  . T3, Free 01/30/2015 3.0  2.3 - 4.2 pg/mL Final  . Free T4 01/30/2015 0.76  0.60 - 1.60 ng/dL Final  . TSH 01/30/2015 2.69  0.35 - 4.50 uIU/mL Final   Will try to decrease MMI to 2.5 mg every day and repeat labs in 2 months. If normal  then, will stop MMI.

## 2015-01-30 NOTE — Patient Instructions (Signed)
Please stop at the lab.  Please come back for a follow-up appointment in 6 months.  

## 2015-05-02 ENCOUNTER — Other Ambulatory Visit (INDEPENDENT_AMBULATORY_CARE_PROVIDER_SITE_OTHER): Payer: Self-pay

## 2015-05-02 DIAGNOSIS — E0500 Thyrotoxicosis with diffuse goiter without thyrotoxic crisis or storm: Secondary | ICD-10-CM

## 2015-05-02 LAB — TSH: TSH: 1.24 u[IU]/mL (ref 0.35–4.50)

## 2015-05-02 LAB — T3, FREE: T3 FREE: 3.2 pg/mL (ref 2.3–4.2)

## 2015-05-02 LAB — T4, FREE: Free T4: 0.68 ng/dL (ref 0.60–1.60)

## 2015-06-13 ENCOUNTER — Ambulatory Visit: Payer: Self-pay | Attending: Family Medicine

## 2015-07-05 ENCOUNTER — Other Ambulatory Visit: Payer: Self-pay | Admitting: Internal Medicine

## 2015-07-10 ENCOUNTER — Ambulatory Visit: Payer: Self-pay | Attending: Family Medicine | Admitting: Family Medicine

## 2015-07-10 ENCOUNTER — Encounter: Payer: Self-pay | Admitting: Family Medicine

## 2015-07-10 VITALS — BP 98/64 | HR 76 | Temp 98.7°F | Resp 16 | Ht 65.5 in | Wt 155.0 lb

## 2015-07-10 DIAGNOSIS — K0889 Other specified disorders of teeth and supporting structures: Secondary | ICD-10-CM

## 2015-07-10 DIAGNOSIS — E0500 Thyrotoxicosis with diffuse goiter without thyrotoxic crisis or storm: Secondary | ICD-10-CM

## 2015-07-10 LAB — TSH: TSH: 2.296 u[IU]/mL (ref 0.350–4.500)

## 2015-07-10 LAB — T4, FREE: FREE T4: 0.87 ng/dL (ref 0.80–1.80)

## 2015-07-10 LAB — T3, FREE: T3 FREE: 2.6 pg/mL (ref 2.3–4.2)

## 2015-07-10 MED ORDER — METHIMAZOLE 5 MG PO TABS: 2.5000 mg | ORAL_TABLET | Freq: Every day | ORAL | Status: DC

## 2015-07-10 NOTE — Assessment & Plan Note (Signed)
Dental referral placed.

## 2015-07-10 NOTE — Progress Notes (Signed)
Patient ID: Yvette Johnson, female   DOB: [birth year], 39 y.o.   MRN: [id number]   Subjective:  Patient ID: Yvette Johnson, female    DOB: Mar 10, 1976  Age: 39 y.o. MRN: [id number]  CC: Medication Refill and Dental Pain  HPI Yvette Johnson presents for  Tele Spanish interpreter utilized during office visit   1. Grave's disease: patient is followed by Endocrinology. She last had TFTs in October. Labs were normal. The plan was to stop methimazole and recheck TFTs in January. She did not get this message. She continued to take the methimazole 2.5 mg daily  until 10 days ago when she ran out. She reports palpitations and trouble sleeping started 3 days ago. She denies CP.   2. Tooth and gum pain: x 6 months. With chipped R lower molar. No bleeding gums. No oral swelling or fever.   Social History  Substance Use Topics  . Smoking status: Former Smoker -- 0.25 packs/day for 23 years    Types: Cigarettes  . Smokeless tobacco: Never Used  . Alcohol Use: No    Outpatient Prescriptions Prior to Visit  Medication Sig Dispense Refill  . Cholecalciferol (VITAMIN D-3 PO) Take 1 capsule by mouth 2 (two) times daily.    Marland Kitchen thiamine (VITAMIN B-1) 100 MG tablet Take 100 mg by mouth daily.    . methimazole (TAPAZOLE) 5 MG tablet Take 0.5 tablets (2.5 mg total) by mouth daily. 30 tablet 1  . Ascorbic Acid (VITAMIN C) 100 MG tablet Take 100 mg by mouth daily. Reported on 07/10/2015    . Multiple Vitamin (MULTIVITAMIN) tablet Take 1 tablet by mouth daily. Reported on 07/10/2015    . Soy Isoflavone 100 MG CAPS Take by mouth. Reported on 07/10/2015    . fluconazole (DIFLUCAN) 150 MG tablet Take 1 tablet (150 mg total) by mouth every 3 (three) days. (Patient not taking: Reported on 06/30/2014) 2 tablet 0   No facility-administered medications prior to visit.    ROS Review of Systems  Constitutional: Negative for fever and chills.  HENT: Positive for dental problem.   Eyes: Negative for visual disturbance.    Respiratory: Negative for shortness of breath.   Cardiovascular: Positive for palpitations. Negative for chest pain.  Gastrointestinal: Negative for abdominal pain and blood in stool.  Musculoskeletal: Negative for back pain and arthralgias.  Skin: Negative for rash.  Allergic/Immunologic: Negative for immunocompromised state.  Hematological: Negative for adenopathy. Does not bruise/bleed easily.  Psychiatric/Behavioral: Positive for sleep disturbance. Negative for suicidal ideas and dysphoric mood.    Objective:  BP 98/64 mmHg  Pulse 76  Temp(Src) 98.7 F (37.1 C) (Oral)  Resp 16  Ht 5' 5.5" (1.664 m)  Wt 155 lb (70.308 kg)  BMI 25.39 kg/m2  SpO2 98%  BP/Weight 07/10/2015 01/30/2015 [id number]  Systolic BP 98 [id number]  Diastolic BP 64 60 62  Wt. (Lbs) 155 154.8 159  BMI 25.39 28.31 29.07    Physical Exam  Constitutional: She is oriented to person, place, and time. She appears well-developed and well-nourished. No distress.  HENT:  Head: Normocephalic and atraumatic.  Mouth/Throat: Oropharynx is clear and moist and mucous membranes are normal. Abnormal dentition. Dental caries present. No dental abscesses. No oropharyngeal exudate.    Cardiovascular: Normal rate, regular rhythm, normal heart sounds and intact distal pulses.   Pulmonary/Chest: Effort normal and breath sounds normal.  Musculoskeletal: She exhibits no edema.  Neurological: She is alert and oriented to person, place, and time.  Skin: Skin is warm and  dry. No rash noted.  Psychiatric: She has a normal mood and affect.     Assessment & Plan:   Problem List Items Addressed This Visit    Graves disease - Primary   Relevant Orders   T3, Free   T4, free   TSH    Other Visit Diagnoses    Pain, dental        Relevant Orders    Ambulatory referral to Dentistry       Meds ordered this encounter  Medications  . methimazole (TAPAZOLE) 5 MG tablet    Sig: Take 0.5 tablets (2.5 mg total) by mouth daily.     Dispense:  30 tablet    Refill:  1    Follow-up: No Follow-up on file.   Boykin Nearing MD

## 2015-07-10 NOTE — Patient Instructions (Addendum)
Yvette Johnson was seen today for medication refill.  Diagnoses and all orders for this visit:  Graves disease -     T3, Free -     T4, free -     TSH -     Discontinue: methimazole (TAPAZOLE) 5 MG tablet; Take 0.5 tablets (2.5 mg total) by mouth daily.  Pain, dental -     Ambulatory referral to Dentistry   STOP methimazole for now. You will be called with lab results.   F/u in 6 month   Dr. Adrian Blackwater

## 2015-07-10 NOTE — Progress Notes (Signed)
Requesting dentist referral Medicine refills  Former smoker  Pain scale #7 No suicidal thought in the past two weeks

## 2015-07-10 NOTE — Assessment & Plan Note (Addendum)
A: patient on low dose methimazole until 10 days ago due to not receiving instructions to stop. P: Check TFTs Discontinued methimazole, I called her [facility] to pass on the message. Will re-order methimazole if TFTs suggest recurrent hyperthyroidism  Patient has f/u next month with her endocrinologist

## 2015-07-17 ENCOUNTER — Telehealth: Payer: Self-pay | Admitting: Family Medicine

## 2015-07-17 ENCOUNTER — Telehealth: Payer: Self-pay

## 2015-07-17 NOTE — Telephone Encounter (Signed)
Pt. Returned call. Please f/u with pt. °

## 2015-07-17 NOTE — Telephone Encounter (Signed)
Interpreter line used [employer] id# [id number] Returned call to patient Patient is aware of her thyroid lab results

## 2015-07-17 NOTE — Telephone Encounter (Signed)
-----   Message from Boykin Nearing, MD sent at 07/11/2015 10:40 AM EST ----- Normal thyroid function test Continue to NOT take methimazole

## 2015-07-17 NOTE — Telephone Encounter (Signed)
Attempted to contact patient this am Call was picked up but nobody on the line  Call was hung up

## 2015-08-02 ENCOUNTER — Encounter: Payer: Self-pay | Admitting: Internal Medicine

## 2015-08-02 ENCOUNTER — Ambulatory Visit (INDEPENDENT_AMBULATORY_CARE_PROVIDER_SITE_OTHER): Payer: Self-pay | Admitting: Internal Medicine

## 2015-08-02 VITALS — BP 104/62 | HR 68 | Temp 98.4°F | Resp 12 | Wt 154.6 lb

## 2015-08-02 DIAGNOSIS — E0500 Thyrotoxicosis with diffuse goiter without thyrotoxic crisis or storm: Secondary | ICD-10-CM

## 2015-08-02 LAB — TSH: TSH: 1.63 u[IU]/mL (ref 0.35–4.50)

## 2015-08-02 LAB — T3, FREE: T3, Free: 3.3 pg/mL (ref 2.3–4.2)

## 2015-08-02 LAB — T4, FREE: Free T4: 0.86 ng/dL (ref 0.60–1.60)

## 2015-08-02 NOTE — Patient Instructions (Addendum)
Please stop at the lab.  Continue to stay off Methimazole.  Please come back for a follow-up appointment in 6 months.

## 2015-08-02 NOTE — Progress Notes (Signed)
Patient ID: Yvette Johnson, female   DOB: Nov 26, 1975, 40 y.o.   MRN: [id number]   HPI  Yvette Johnson is a 40 y.o.-year-old female, returning for f/u for Graves ds. Last visit 6 months ago. She is here accompanied by the [place] interpreter.  Since last visit >> she developed tooth sensitivity >> will see dentist soon.  Reviewed hx: In 2015, she presented to see her PCP for high heart rate and pain in pelvis. TFTs were found to be abnormal. Repeated thyroid tests to month later, where again abnormal.  I reviewed pt's thyroid tests: Lab Results  Component Value Date   TSH 2.296 07/10/2015   TSH 1.24 05/02/2015   TSH 2.69 01/30/2015   TSH 1.00 09/27/2014   TSH <0.008* 07/03/2014   TSH <0.008* 04/24/2014   FREET4 0.87 07/10/2015   FREET4 0.68 05/02/2015   FREET4 0.76 01/30/2015   FREET4 0.54* 09/27/2014   FREET4 1.70 07/03/2014   FREET4 1.40 05/03/2014   FREET4 1.47 04/24/2014    She had high Graves antibodies: Component     Latest Ref Rng 06/30/2014  TSI     <140 % baseline 158 (H)   We suspected Graves ds. based on the presentation and the high thyroid antibodies, however we did not perform a thyroid uptake and scan, since cost was an issue for her.  We started MMI 5 mg 2x a day, but she had HAs and weight gain/bloating >> dropped the dose to 5 mg daily. As TFTs improved >> we decreased the MMI to 2.5 mg daily in 09/2014, and then stopped 04/2015. TFTs remained normal after stopping MMI.  Pt denies feeling nodules in neck, no hoarseness, no dysphagia, no choking/no odynophagia, no SOB with lying down.  She c/o the following sx: - less fatigue - no weight loss/gain - + heat and cold intolerance - no tremors - much less anxiety - 2 weeks before menses - less palpitations - no hyperdefecation  Pt does have a FH of thyroid ds: mother, aunts (hyper- and hypo-thyroidism). + FH of thyroid cancer in aunt. No h/o radiation tx to head or neck.  ROS: Constitutional: see HPI, +  nocturia Eyes: + blurry vision, no xerophthalmia ENT: no Hoarseness, no  sore throat, no tinnitus, no hypoacusis Cardiovascular: + CP/SOB/+ palpitations/+ leg swelling Respiratory:no cough/SOB Gastrointestinal:+ N/no V/D/C/ heartburn Musculoskeletal: no muscle aches/+ joint aches Skin: no rashes, + hair loss Neurological: no tremors/numbness/tingling/dizziness, + HA  I reviewed pt's medications, allergies, PMH, social hx, family hx, and changes were documented in the history of present illness. Otherwise, unchanged from my initial visit note:  Past Medical History  Diagnosis Date  . Uterine fibroid 2012    Past Surgical History  Procedure Laterality Date  . Cesarean section  2010   . Breast cyst aspiration Right 2008    History   Social History  . Marital Status: Married    Spouse Name: N/A    Number of Children: 1  . Years of Education: 12    Occupational History  . Hair Stylist     Social History Main Topics  . Smoking status: Current Every Day Smoker - now 3 cigs a day-- 0.25 packs/day for 23 years    Types: Cigarettes  . Smokeless tobacco: Never Used  . Alcohol Use: No  . Drug Use: No  . Sexual Activity: Yes    Birth Control/ Protection: None   Social History Narrative   Lives with husband, daughter and mother.    Current Outpatient  Prescriptions on File Prior to Visit  Medication Sig Dispense Refill  . Ascorbic Acid (VITAMIN C) 100 MG tablet Take 100 mg by mouth daily. Reported on 07/10/2015    . Cholecalciferol (VITAMIN D-3 PO) Take 1 capsule by mouth 2 (two) times daily.    . Multiple Vitamin (MULTIVITAMIN) tablet Take 1 tablet by mouth daily. Reported on 07/10/2015     No current facility-administered medications on file prior to visit.   Allergies  Allergen Reactions  . Antihistamines, Diphenhydramine-Type Shortness Of Breath  . Penicillins Nausea And Vomiting  . Vicks Dayquil Cold & Flu [Dm-Phenylephrine-Acetaminophen] Shortness Of Breath   Family  History  Problem Relation Age of Onset  . Cancer Mother 54    breast cancer, 2009   . Asthma Father   . Heart disease Father 48  . Diabetes Neg Hx    PE: BP 104/62 mmHg  Pulse 68  Temp(Src) 98.4 F (36.9 C) (Oral)  Resp 12  Wt 154 lb 9.6 oz (70.126 kg)  SpO2 98% Body mass index is 25.33 kg/(m^2). Wt Readings from Last 3 Encounters:  08/02/15 154 lb 9.6 oz (70.126 kg)  07/10/15 155 lb (70.308 kg)  01/30/15 154 lb 12.8 oz (70.217 kg)   Constitutional: normal weight, in NAD Eyes: PERRLA, EOMI, no exophthalmos, no lid lag, no stare ENT: moist mucous membranes, no thyromegaly, no cervical lymphadenopathy Cardiovascular: RRR, No MRG Respiratory: CTA B Gastrointestinal: abdomen soft, NT, ND, BS+ Musculoskeletal: no deformities, strength intact in all 4 Skin: moist, warm, no rashes Neurological: no tremor with outstretched hands, DTR normal in all 4  ASSESSMENT: 1. Graves ds.  PLAN:  1. Patient with Graves ds., initially with thyrotoxic sxs: weight loss, palpitations, anxiety, now all resolved. We suspected Graves (due to presentation and increased TSI antibodies), so we decided to bypass a thyroid uptake and scan due to finances. She was on MMI >> now stopped with persistent normal TFTs.  - we'll check her TSH, free T4, free T3 today - I do not feel that we need to add beta blockers at this time, since she is not tachycardic, anxious, or tremulous - we will need to call her through the Spanish interpreter line - call her directly at [phone]. - RTC in 6 months, but possibly sooner for repeat labs  Office Visit on 08/02/2015  Component Date Value Ref Range Status  . Free T4 08/02/2015 0.86  0.60 - 1.60 ng/dL Final  . T3, Free 08/02/2015 3.3  2.3 - 4.2 pg/mL Final  . TSH 08/02/2015 1.63  0.35 - 4.50 uIU/mL Final   Normal TFTs. Continue to stay off MMI>

## 2015-08-08 ENCOUNTER — Encounter: Payer: Self-pay | Admitting: *Deleted

## 2015-09-24 ENCOUNTER — Ambulatory Visit: Payer: Self-pay

## 2015-10-01 ENCOUNTER — Encounter (HOSPITAL_COMMUNITY): Payer: Self-pay | Admitting: Emergency Medicine

## 2015-10-01 ENCOUNTER — Emergency Department (HOSPITAL_COMMUNITY)
Admission: EM | Admit: 2015-10-01 | Discharge: 2015-10-01 | Disposition: A | Discharge status: Home / Self Care | Payer: Self-pay | Attending: Emergency Medicine | Admitting: Emergency Medicine

## 2015-10-01 DIAGNOSIS — K0889 Other specified disorders of teeth and supporting structures: Secondary | ICD-10-CM | POA: Insufficient documentation

## 2015-10-01 DIAGNOSIS — Z79899 Other long term (current) drug therapy: Secondary | ICD-10-CM | POA: Insufficient documentation

## 2015-10-01 DIAGNOSIS — Z87891 Personal history of nicotine dependence: Secondary | ICD-10-CM | POA: Insufficient documentation

## 2015-10-01 DIAGNOSIS — Z880 Allergy status to penicillin: Secondary | ICD-10-CM | POA: Insufficient documentation

## 2015-10-01 DIAGNOSIS — Z86018 Personal history of other benign neoplasm: Secondary | ICD-10-CM | POA: Insufficient documentation

## 2015-10-01 MED ORDER — MELOXICAM 7.5 MG PO TABS: 7.5000 mg | ORAL_TABLET | Freq: Every day | ORAL | Status: DC

## 2015-10-01 NOTE — ED Notes (Signed)
Pt c/o left upper toothache onset  Saturday night. Pt really not talking.

## 2015-10-01 NOTE — Discharge Instructions (Signed)
Read the information below.  Use the prescribed medication as directed.  Please discuss all new medications with your pharmacist.  You may return to the Emergency Department at any time for worsening condition or any new symptoms that concern you.  Do not take the medication naproxen while taking the prescribed medication.  You may continue to take the medication "Tramadol."  Please call the dentist listed above within 48 hours to schedule a close follow up appointment.  If you develop fevers, swelling in your face, difficulty swallowing or breathing, return to the ER immediately for a recheck.   Lea la informacin a continuacin. Use la medicacin prescrita como se indica. Por favor discuta todos los nuevos medicamentos con su farmacutico. Usted puede regresar al Departamento de Emergencias en cualquier momento por empeoramiento de la condicin o cualquier nuevo sntoma que le afecte. No tome el medicamento naproxen mientras toma la medicacin prescrita. Usted puede seguir tomando el medicamento "Tramadol". Por favor llame a su dentista antes de 48 horas para programar una cita de seguimiento. Si usted desarrolla fiebre, hinchazn en la cara, dificultad para tragar o respirar, regrese a la sala de emergencias inmediatamente.   [facility] [employer] is a great source of information about community services available.  Access by dialing 2-1-1 from anywhere in [place], or by website -  Cust[employer]ianSupp[employer].fi.   Other Local Resources (Updated 07/2015)  Dental  Care   Services    Phone Number and Address  Cost  [facility] For children 63 - 47 years of age:   Cleaning  Tooth brushing/flossing instruction  Sealants, fillings, crowns  Extractions  Emergency treatment  [phone] [address], [place] [address] Charges based on family income.  Medicaid and some insurance plans accepted.     [facility], fillings, crowns  Extractions  Emergency treatment [phone] [address] [place], [place]  Pregnant women 38 years of age or older with a Medicaid card  [facility] - [place]  Cleaning  Sealants, fillings, crowns  Extractions  Emergency treatment [phone] [address] [place], [place] Pregnant women 37 years of age or older with a Medicaid card  [facility] For children 69 - 35 years of age:   Cleaning  Tooth brushing/flossing instruction  Sealants, fillings, crowns  Extractions  Emergency treatment Limited orthodontic services for patients with Medicaid [phone] [address], [place] [address] Medicaid and [facility] Health Choice cover for children up to age 67 and pregnant women.  Parents of children up to age 80 without Medicaid pay a reduced fee at time of service.  [place] For children 41 - 21 years of age:   Cleaning  Tooth brushing/flossing instruction  Sealants, fillings, crowns  Extractions  Emergency treatment Limited orthodontic services for patients with Medicaid [phone] [place], [place].  Medicaid and [place] Health Choice cover for children up to age 73 and pregnant women.  Parents of children up to age 54 without Medicaid pay a reduced fee.  [facility]  Sealants, fillings, crowns  Extractions  Hours: Tuesdays and Thursdays, 4:15 - 8 pm [phone] [address] [place], [place] [address] Services free of charge to [facility] residents ages 18-64 who  do not have health insurance, Medicare, Medicaid, or VA benefits and fall within federal poverty guidelines  [facility]    Provides dental care in addition to primary medical care,  nutritional counseling, and pharmacy:  Cleaning  Sealants, fillings, crowns  Extractions                  [phone] [facility] [place], [place], [place], [place], [place], [facility], [place], [place] [facility] [place], [place], [place], most insurance.  Also provides services available to all with fees adjusted based on ability to pay.    [facility]  Cleaning  Tooth brushing/flossing instruction  Sealants, fillings, crowns  Extractions  Emergency treatment Hours: Tuesdays, Thursdays, and Fridays from 8 am to 5 pm by appointment only. [phone] [place], [place] [address] [facility] residents with Medicaid (depending on eligibility) and children with [facility] Health Choice - call for more information.  [facility]  Extractions only  Hours: 2nd and 4th Thursday of each month from 6:30 am - 9 am.   [phone] ext. [place], [place] [address] Ages 69 and older only.  Patients are seen on a first come, first served basis.  [employer]  [employer]  Extractions  Orthodontics  Endodontics  Implants/Crowns/Bridges  Complete and partial dentures [phone] [place], [place] Patients must complete an application for services.  There is often a waiting list.

## 2015-10-01 NOTE — ED Provider Notes (Signed)
CSN: [id number]     Arrival date & time 10/01/15  0945 History  By signing my name below, I, Emmanuella Mensah, attest that this documentation has been prepared under the direction and in the presence of Norrine Ballester, PA-C. Electronically Signed: Judithann Sauger, ED Scribe. 10/01/2015. 11:26 AM.    Chief Complaint  Patient presents with  . Dental Pain   The history is provided by the patient. No language interpreter was used.   HPI Comments: Yvette Johnson is a 40 y.o. female who presents to the Emergency Department complaining of gradually worsening constant 8/10 left upper molar pain onset 2 days ago. Pt was seen by her dentist in January 2017 where she had a right molar extraction and was told that the left molars were coming in at that time. Pt was prescribed Tramadol on 08/08/15 after the extraction but did not take it at that time because she did not need it but now she's taken one tramadol, benzocaine gel, and Naprosyn. She adds that only cold water helps the pain. She denies any fever, chills, sore throat, difficulty swallowing, or SOB.   Dentist on [place], unknown name.     Past Medical History  Diagnosis Date  . Uterine fibroid 2012    Past Surgical History  Procedure Laterality Date  . Cesarean section  2010   . Breast cyst aspiration Right 2008    Family History  Problem Relation Age of Onset  . Cancer Mother 64    breast cancer, 2009   . Asthma Father   . Heart disease Father 32  . Diabetes Neg Hx    Social History  Substance Use Topics  . Smoking status: Former Smoker -- 0.25 packs/day for 23 years    Types: Cigarettes  . Smokeless tobacco: Never Used  . Alcohol Use: No   OB History    Gravida Para Term Preterm AB TAB SAB Ectopic Multiple Living   1 1        1      Review of Systems  Constitutional: Negative for fever and chills.  HENT: Positive for dental problem (left upper molar). Negative for facial swelling, sore throat and trouble swallowing.    Respiratory: Negative for shortness of breath.   Genitourinary: Positive for frequency.  Skin: Negative for color change.  Allergic/Immunologic: Negative for immunocompromised state.  Psychiatric/Behavioral: Negative for self-injury.      Allergies  Antihistamines, diphenhydramine-type; Penicillins; and Vicks dayquil cold & flu  Home Medications   Prior to Admission medications   Medication Sig Start Date End Date Taking? Authorizing Provider  Ascorbic Acid (VITAMIN C) 100 MG tablet Take 100 mg by mouth daily. Reported on 07/10/2015    Historical Provider, MD  Cholecalciferol (VITAMIN D-3 PO) Take 1 capsule by mouth 2 (two) times daily.    Historical Provider, MD  meloxicam (MOBIC) 7.5 MG tablet Take 1 tablet (7.5 mg total) by mouth daily. 10/01/15   Clayton Bibles, PA-C  Multiple Vitamin (MULTIVITAMIN) tablet Take 1 tablet by mouth daily. Reported on 07/10/2015    Historical Provider, MD   BP 128/92 mmHg  Pulse 74  Temp(Src) 97.9 F (36.6 C) (Oral)  Resp 18  SpO2 99%  LMP 08/27/2015 Physical Exam  Constitutional: She appears well-developed and well-nourished. No distress.  HENT:  Head: Normocephalic and atraumatic.  Mouth/Throat: Uvula is midline and oropharynx is clear and moist. Mucous membranes are not dry. No uvula swelling. No oropharyngeal exudate, posterior oropharyngeal edema, posterior oropharyngeal erythema or tonsillar abscesses.  Remote fracture of left lower molar (2nd) Decay of right lower molar (1st) but no other molars.  Left upper 2nd molar is a single molar and there appears to be an erupting 3rd molar: Tenderness over the gingiva posterior to 2nd molar that may indicate near eruption to 3rd molar, no erythema, no fluctuance, no discharge.   Neck: Trachea normal, normal range of motion and phonation normal. Neck supple. No tracheal tenderness present. No rigidity. No tracheal deviation, no edema, no erythema and normal range of motion present.  Cardiovascular:  Normal rate.   Pulmonary/Chest: Effort normal and breath sounds normal. No stridor.  Lymphadenopathy:    She has no cervical adenopathy.  Neurological: She is alert.  Skin: She is not diaphoretic.  Nursing note and vitals reviewed.   ED Course  Procedures (including critical care time) DIAGNOSTIC STUDIES: Oxygen Saturation is 99% on RA, normal by my interpretation.    COORDINATION OF CARE: 11:18 AM- Pt advised of plan for treatment and pt agrees. Pt will receive Meloxicam.  MDM   Final diagnoses:  Pain of molar    Afebrile, nontoxic patient with new dental pain.  No obvious abscess.  Suspect eruption of wisdom tooth vs increased pain from known wisdom tooth problem. Doubt infection. No concerning findings on exam.  Doubt deep space head or neck infection.  Doubt Ludwig's angina.  D/C home with pain medication and dental follow up.  Discussed findings, treatment, and follow up  with patient.  Pt given return precautions.  Pt verbalizes understanding and agrees with plan.        I personally performed the services described in this documentation, which was scribed in my presence. The recorded information has been reviewed and is accurate.   Clayton Bibles, PA-C 10/01/15 [place], DO 10/01/15 1512

## 2015-10-01 NOTE — ED Notes (Signed)
Declined W/C at D/C and was escorted to lobby by RN. 

## 2015-10-04 ENCOUNTER — Ambulatory Visit: Payer: Self-pay | Admitting: Family Medicine

## 2015-10-23 ENCOUNTER — Ambulatory Visit: Payer: Self-pay | Attending: Family Medicine | Admitting: Family Medicine

## 2015-10-23 ENCOUNTER — Encounter: Payer: Self-pay | Admitting: Family Medicine

## 2015-10-23 VITALS — BP 114/79 | HR 72 | Temp 98.1°F | Resp 16 | Ht 67.0 in | Wt 153.0 lb

## 2015-10-23 DIAGNOSIS — K011 Impacted teeth: Secondary | ICD-10-CM

## 2015-10-23 DIAGNOSIS — Z87891 Personal history of nicotine dependence: Secondary | ICD-10-CM | POA: Insufficient documentation

## 2015-10-23 DIAGNOSIS — E0500 Thyrotoxicosis with diffuse goiter without thyrotoxic crisis or storm: Secondary | ICD-10-CM

## 2015-10-23 DIAGNOSIS — D241 Benign neoplasm of right breast: Secondary | ICD-10-CM | POA: Insufficient documentation

## 2015-10-23 DIAGNOSIS — N644 Mastodynia: Secondary | ICD-10-CM | POA: Insufficient documentation

## 2015-10-23 DIAGNOSIS — Z79899 Other long term (current) drug therapy: Secondary | ICD-10-CM | POA: Insufficient documentation

## 2015-10-23 LAB — CBC
HEMATOCRIT: 39.3 % (ref 35.0–45.0)
Hemoglobin: 12.8 g/dL (ref 11.7–15.5)
MCH: 31.3 pg (ref 27.0–33.0)
MCHC: 32.6 g/dL (ref 32.0–36.0)
MCV: 96.1 fL (ref 80.0–100.0)
MPV: 9.8 fL (ref 7.5–12.5)
PLATELETS: 276 10*3/uL (ref 140–400)
RBC: 4.09 MIL/uL (ref 3.80–5.10)
RDW: 13.3 % (ref 11.0–15.0)
WBC: 6.1 10*3/uL (ref 3.8–10.8)

## 2015-10-23 LAB — T3, FREE: T3, Free: 2.4 pg/mL (ref 2.3–4.2)

## 2015-10-23 LAB — TSH: TSH: 2.07 m[IU]/L

## 2015-10-23 LAB — T4, FREE: FREE T4: 1 ng/dL (ref 0.8–1.8)

## 2015-10-23 NOTE — Progress Notes (Signed)
F/U TSH  Stated not taking medication since January  No tobacco user  Breast pain, cyst on rt breast

## 2015-10-23 NOTE — Patient Instructions (Addendum)
Yvette Johnson was seen today for referral and breast pain.  Diagnoses and all orders for this visit:  Graves disease -     TSH -     T3, Free -     T4, free -     CBC  Fibroadenoma of right breast in female  Impacted third molar tooth -     Ambulatory referral to Oral Maxillofacial Surgery   F/u in 6 weeks for Grave's disease    Dr. Adrian Blackwater

## 2015-10-23 NOTE — Assessment & Plan Note (Signed)
Hx of Grave's disease with recurrent symptoms off methimazole  4 months F/u TFTs

## 2015-10-23 NOTE — Progress Notes (Signed)
Subjective:  Patient ID: Yvette Johnson, female    DOB: September 19, 1975  Age: 40 y.o. MRN: [id number]  CC: Referral and Breast Pain  Spanish interpreter used  HPI Yvette Johnson presents for    1. Grave's disease: no longer taking methimazole. TFTs had normalized. Since February 2017, she has experienced heat intolerance, menstrual irregularities with no menses in January and February, two menses in March, 2 menses so far this month, fatigue, palpitations, sleep changes. Her menstrual flow was heavy in March. Flow as heavy for the first few days in April. Blood flow last from 1-4 days.   2. R breast pain: since stopping methimazole in January 2016. She developed breast tenderness similar to pre-menstrual tenderness but was not having menstrual periods. She also noticed a swelling in her R breast that grows and minimizes with her cycle. She has known hx of R breast fibroadenoma s/p benign biopsy in 2016.   3. Referral: she has seen dentistry. She has been recommended to have lower third molars removed. She is looking for an Designer, industrial/product.    Social History  Substance Use Topics  . Smoking status: Former Smoker -- 0.25 packs/day for 23 years    Types: Cigarettes  . Smokeless tobacco: Never Used  . Alcohol Use: No    Outpatient Prescriptions Prior to Visit  Medication Sig Dispense Refill  . Ascorbic Acid (VITAMIN C) 100 MG tablet Take 100 mg by mouth daily. Reported on 10/23/2015    . Cholecalciferol (VITAMIN D-3 PO) Take 1 capsule by mouth 2 (two) times daily. Reported on 10/23/2015    . meloxicam (MOBIC) 7.5 MG tablet Take 1 tablet (7.5 mg total) by mouth daily. (Patient not taking: Reported on 10/23/2015) 14 tablet 0  . Multiple Vitamin (MULTIVITAMIN) tablet Take 1 tablet by mouth daily. Reported on 10/23/2015     No facility-administered medications prior to visit.    ROS Review of Systems  Constitutional: Negative for fever and chills.  Eyes: Positive for visual disturbance.    Respiratory: Negative for shortness of breath.   Cardiovascular: Positive for palpitations. Negative for chest pain.  Gastrointestinal: Negative for abdominal pain and blood in stool.  Endocrine: Positive for heat intolerance.  Genitourinary: Positive for menstrual problem.  Musculoskeletal: Negative for back pain and arthralgias.  Skin: Negative for rash.  Allergic/Immunologic: Negative for immunocompromised state.  Hematological: Negative for adenopathy. Does not bruise/bleed easily.  Psychiatric/Behavioral: Positive for sleep disturbance and dysphoric mood. Negative for suicidal ideas.    Objective:  BP 114/79 mmHg  Pulse 72  Temp(Src) 98.1 F (36.7 C) (Oral)  Resp 16  Ht 5\' 7"  (1.702 m)  Wt 153 lb (69.4 kg)  BMI 23.96 kg/m2  SpO2 100%  LMP 10/21/2015  BP/Weight 10/23/2015 10/01/2015 [id number]  Systolic BP 99991111 0000000 [id number]  Diastolic BP 79 92 62  Wt. (Lbs) 153 - 154.6  BMI 23.96 - 25.33    Physical Exam  Constitutional: She appears well-developed and well-nourished. No distress.  Cardiovascular: Normal rate, regular rhythm and normal heart sounds.   Pulmonary/Chest: Effort normal and breath sounds normal. No respiratory distress. She has no wheezes. She has no rales. She exhibits no tenderness. Right breast exhibits mass. Right breast exhibits no inverted nipple, no nipple discharge, no skin change and no tenderness. Left breast exhibits no inverted nipple, no mass, no nipple discharge, no skin change and no tenderness. Breasts are symmetrical.       Assessment & Plan:   There are no diagnoses linked to  this encounter. [name] was seen today for referral and breast pain.  Diagnoses and all orders for this visit:  Graves disease -     TSH -     T3, Free -     T4, free -     CBC  Fibroadenoma of right breast in female  Impacted third molar tooth -     Ambulatory referral to Oral Maxillofacial Surgery   No orders of the defined types were placed in this  encounter.    Follow-up: No Follow-up on file.   Boykin Nearing MD

## 2015-10-23 NOTE — Assessment & Plan Note (Signed)
Exam consistent with known fibroadenoma Plan for mammogram screening at age 40

## 2015-10-24 IMAGING — MG MM DIAG BREAST TOMO BILATERAL
6 of 10 series · 6 of 30 positions shown · non-contrast
Comparison: None.

CLINICAL DATA: 38-year-old female with a palpable abnormality in
the slightly upper slightly outer/periareolar right breast.

EXAM:
DIGITAL DIAGNOSTIC BILATERAL MAMMOGRAM WITH 3D TOMOSYNTHESIS WITH
CAD
ULTRASOUND RIGHT BREAST

[R CC]
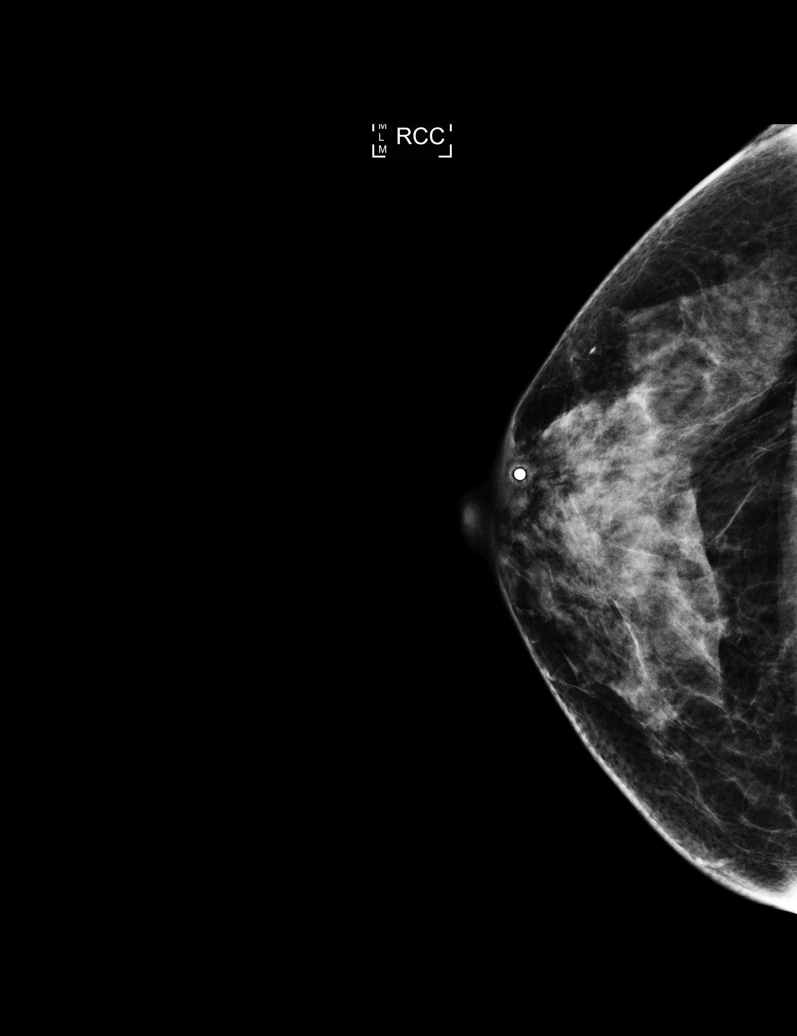

[R MLO]
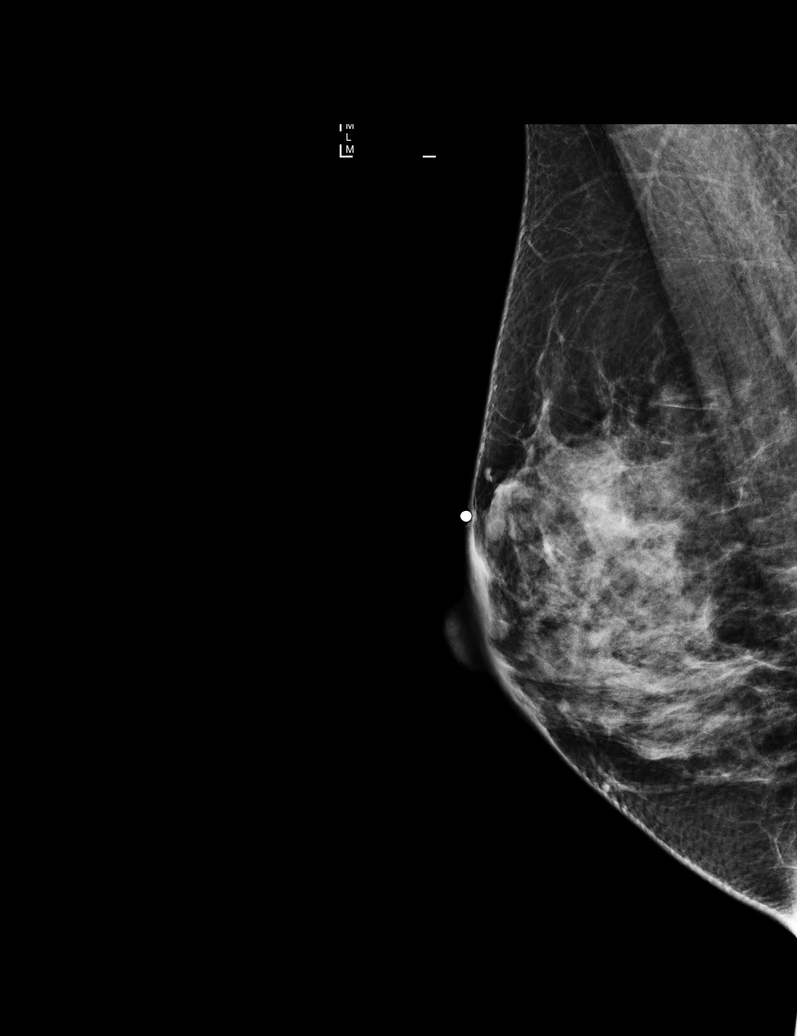

[L MLO]
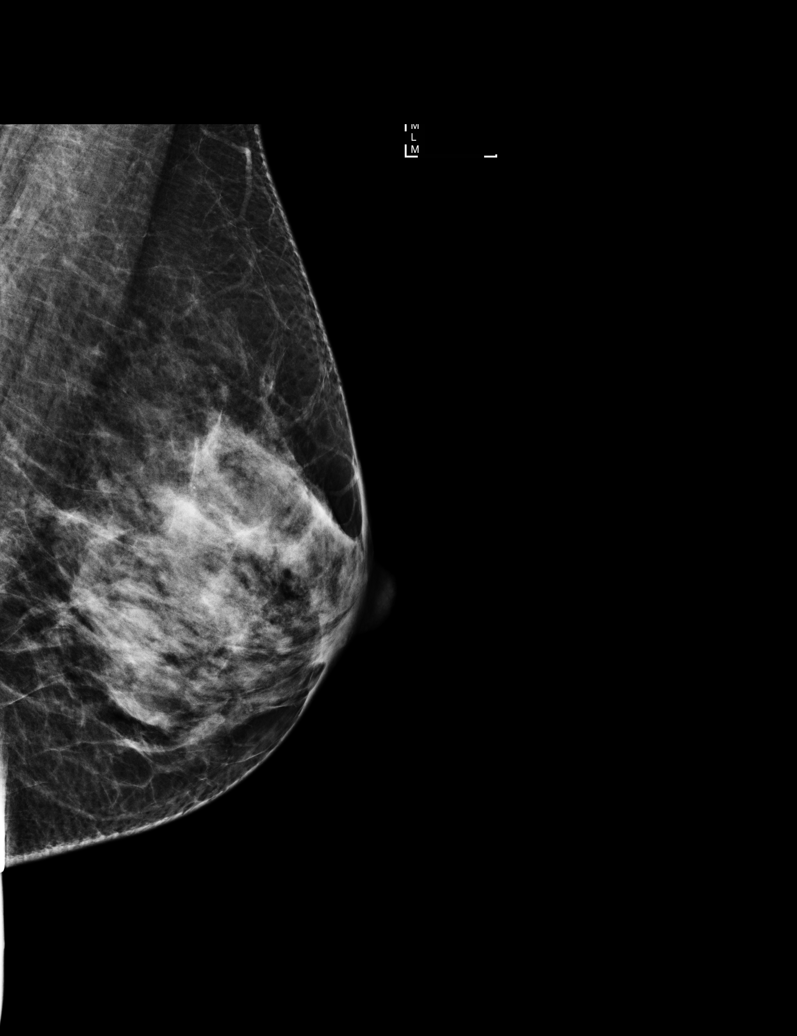

[L CC]
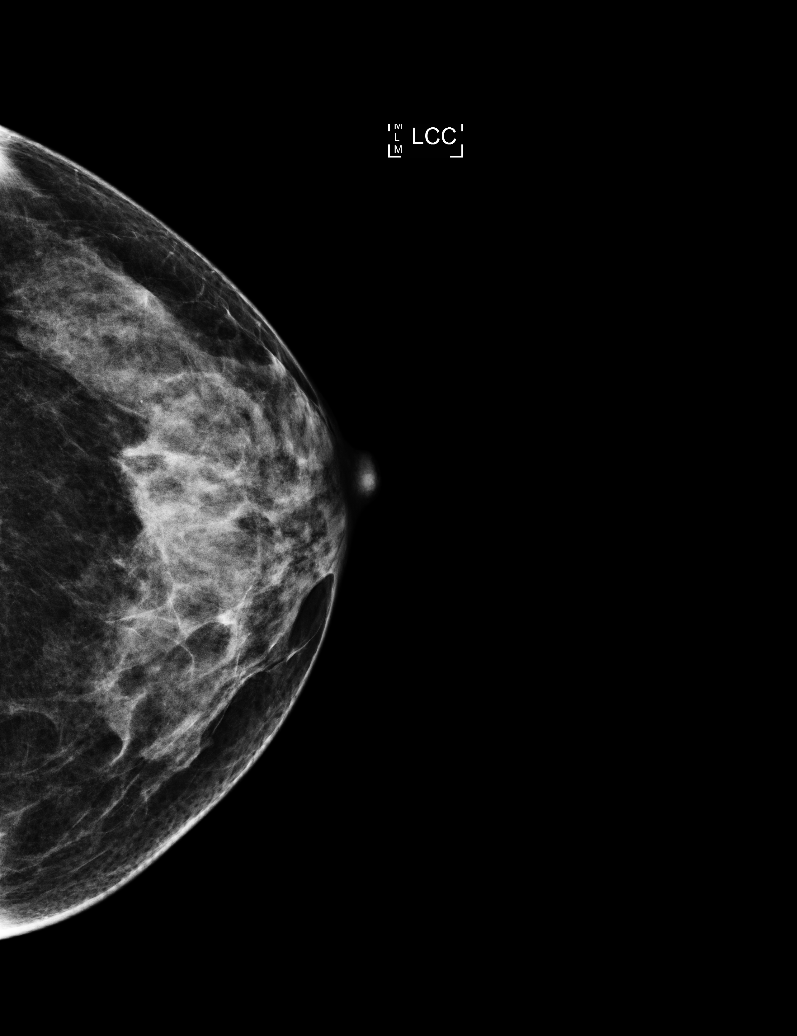

[R TAN]
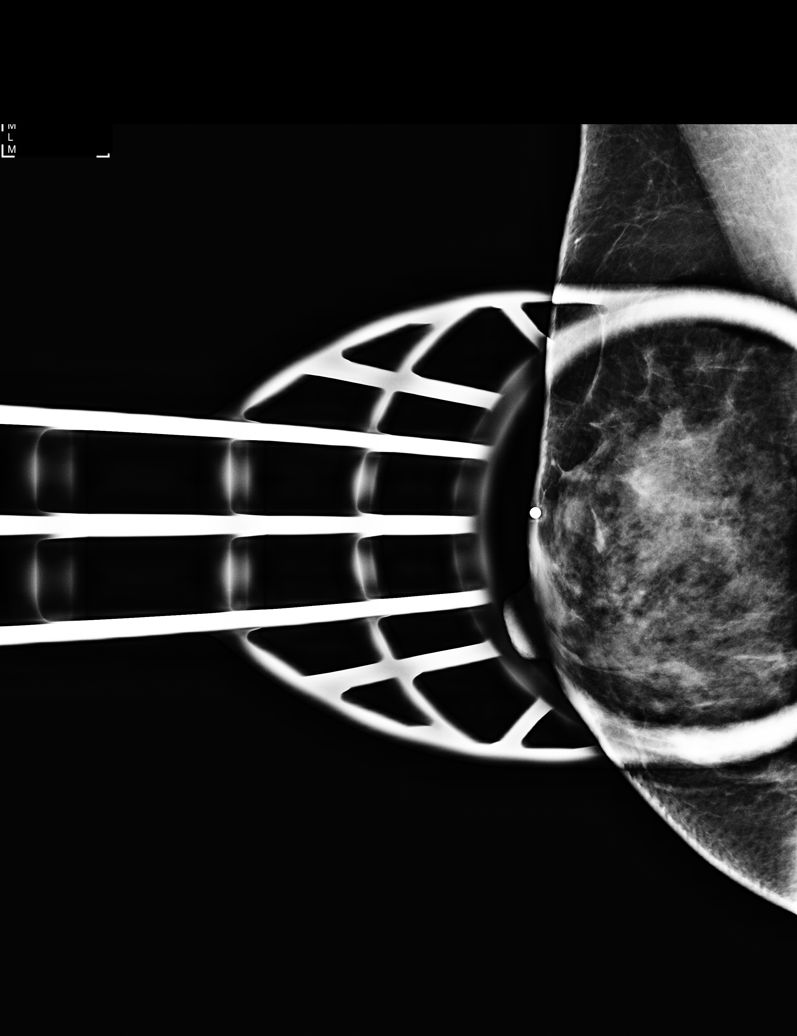

[R CC tomo · tomo slice 31/60.0]
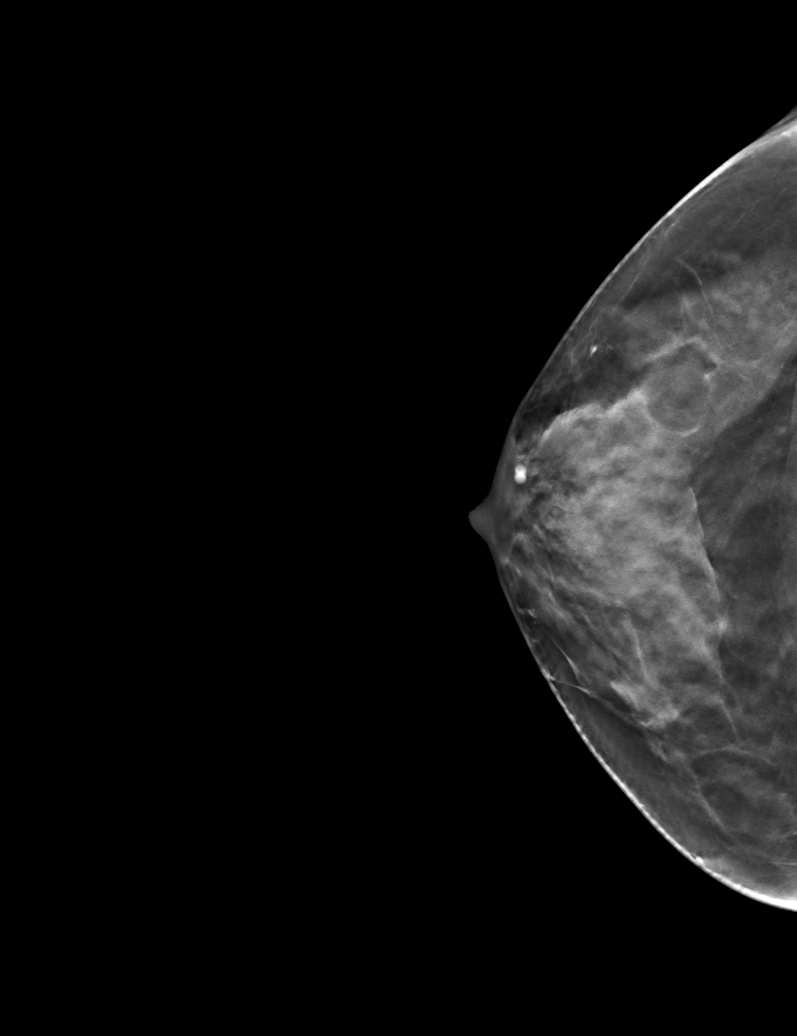

[6 of 30 positions shown; findings below may reference images not displayed]

ACR Breast Density Category c: The breast tissue is heterogeneously
dense, which may obscure small masses.
FINDINGS: No suspicious masses or calcifications are seen in the left breast.

Spot compression tangential tomosynthesis was performed over the
palpable site of concern in the slightly upper slightly outer right
breast with an oval mass with partially obscured margins seen
measuring approximately 0.7 cm.

Mammographic images were processed with CAD.

Physical examination at site of palpable concern in the periareolar
right breast reveals a firm nodule at the approximate 11 o'clock
position.

Targeted ultrasound of the right breast was performed demonstrating
several cysts in the periareolar right breast, with a cyst at 11
o'clock 2 cm from the nipple measuring 0.6 x 0.3 x 0.6 cm and an
additional cyst at 11 o'clock 2 cm from the nipple just beneath the
skin surface measuring 0.6 x 0.4 x 0.6 cm.

There is a mixed echogenicity slightly irregular hypoechoic mass at
11 o'clock 1 cm from the nipple all measuring 0.8 x 0.5 by 1.5 cm.
The patient states this is an area of a prior surgical excision.

There is an oval gently lobulated uniformly hypoechoic mass at 11
o'clock 4 cm from the nipple measuring 1 x 0.6 x 0.8 cm, likely a
fibroadenoma.

No lymphadenopathy seen in the right axilla.
IMPRESSION: 1. Multiple cysts in the periareolar right breast.

2. Probably benign mass in the right breast at 10 o'clock 4 cm from
the nipple likely representing a fibroadenoma.

3. Indeterminate right breast mass at 11 o'clock 1 cm from the
nipple which may also represent a fibroadenoma, however the margins
are irregular.

RECOMMENDATION:
1. Ultrasound-guided biopsy of the indeterminate mass at 11 o'clock
1 cm from the nipple is recommended. This is scheduled for
11/02/2014 at 9 a.m..

2. Six-month follow-up of the probably benign mass in the right
breast at 10 o'clock likely representing a fibroadenoma.

I have discussed the findings and recommendations with the patient.
Results were also provided in writing at the conclusion of the
visit. If applicable, a reminder letter will be sent to the patient
regarding the next appointment.

BI-RADS CATEGORY  4: Suspicious.

## 2015-10-31 ENCOUNTER — Telehealth: Payer: Self-pay | Admitting: *Deleted

## 2015-10-31 DIAGNOSIS — N951 Menopausal and female climacteric states: Secondary | ICD-10-CM

## 2015-10-31 IMAGING — MG MM DIANOSTIC UNILATERAL R
2 series · 2 of 2 positions shown · non-contrast
Comparison: Previous exam(s).

CLINICAL DATA: Status post ultrasound-guided core biopsy of mass in
the retroareolar region of the right breast.

EXAM:
DIAGNOSTIC RIGHT MAMMOGRAM POST ULTRASOUND BIOPSY

[R ML]
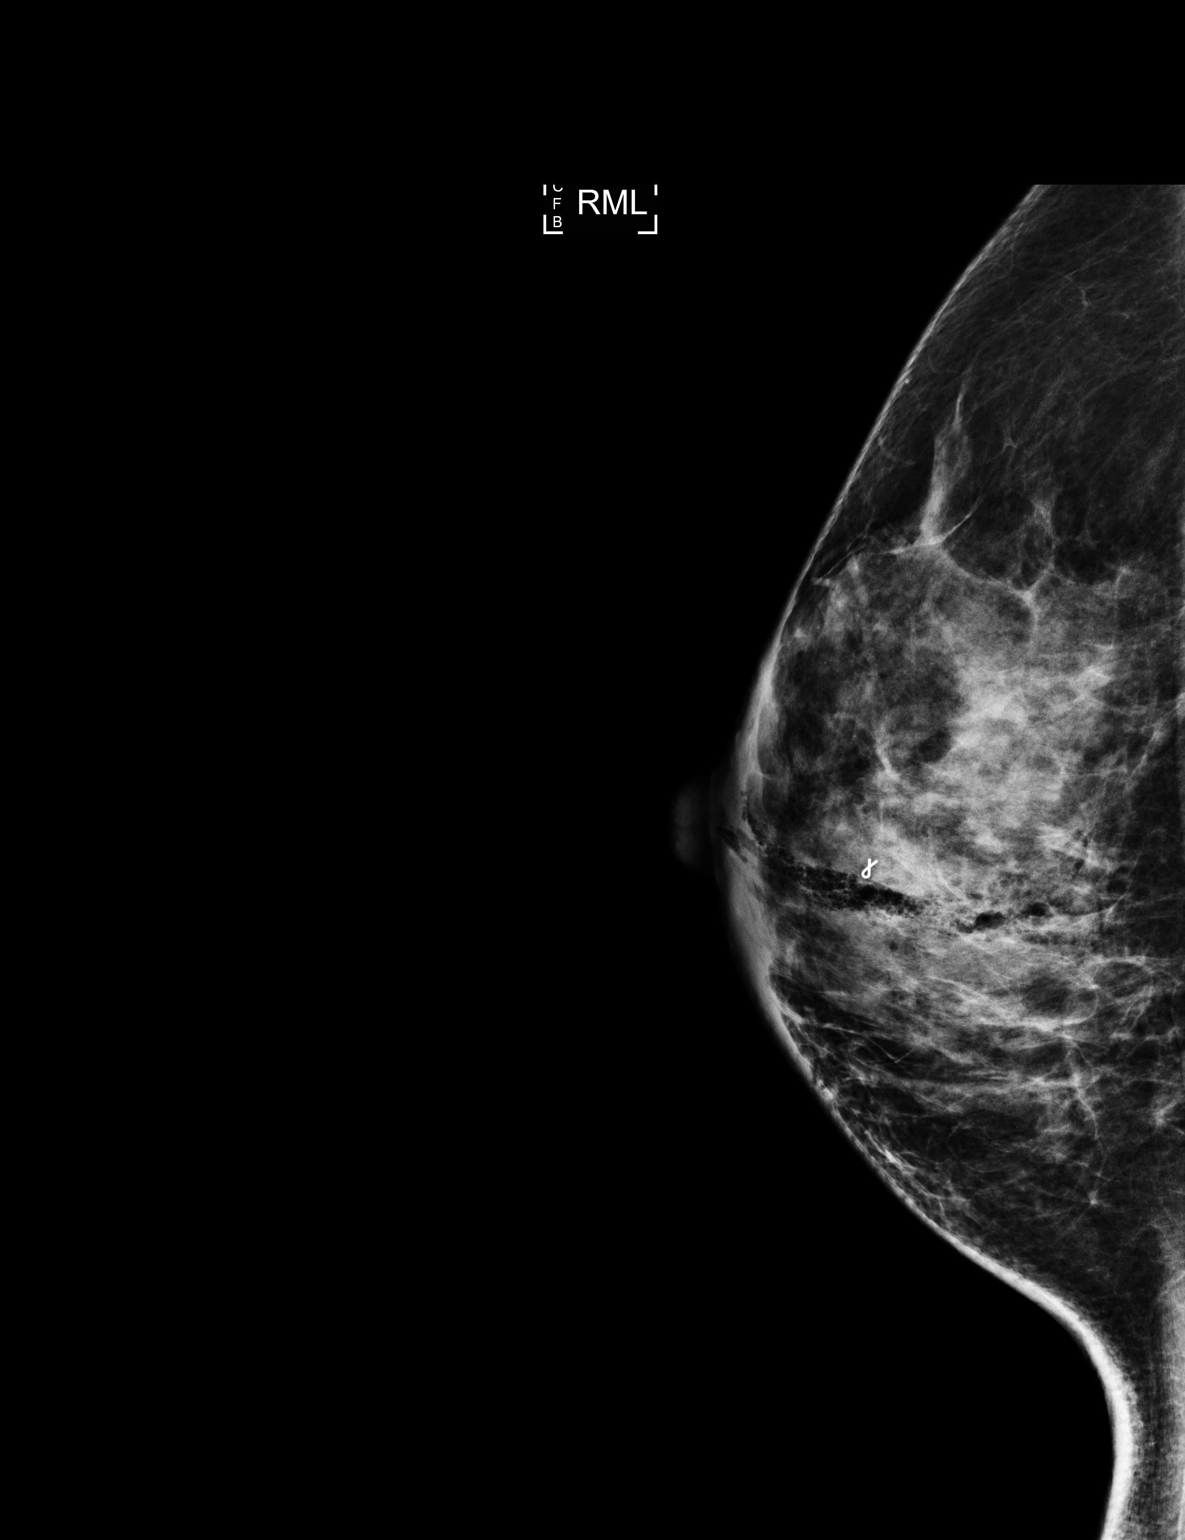

[R CC]
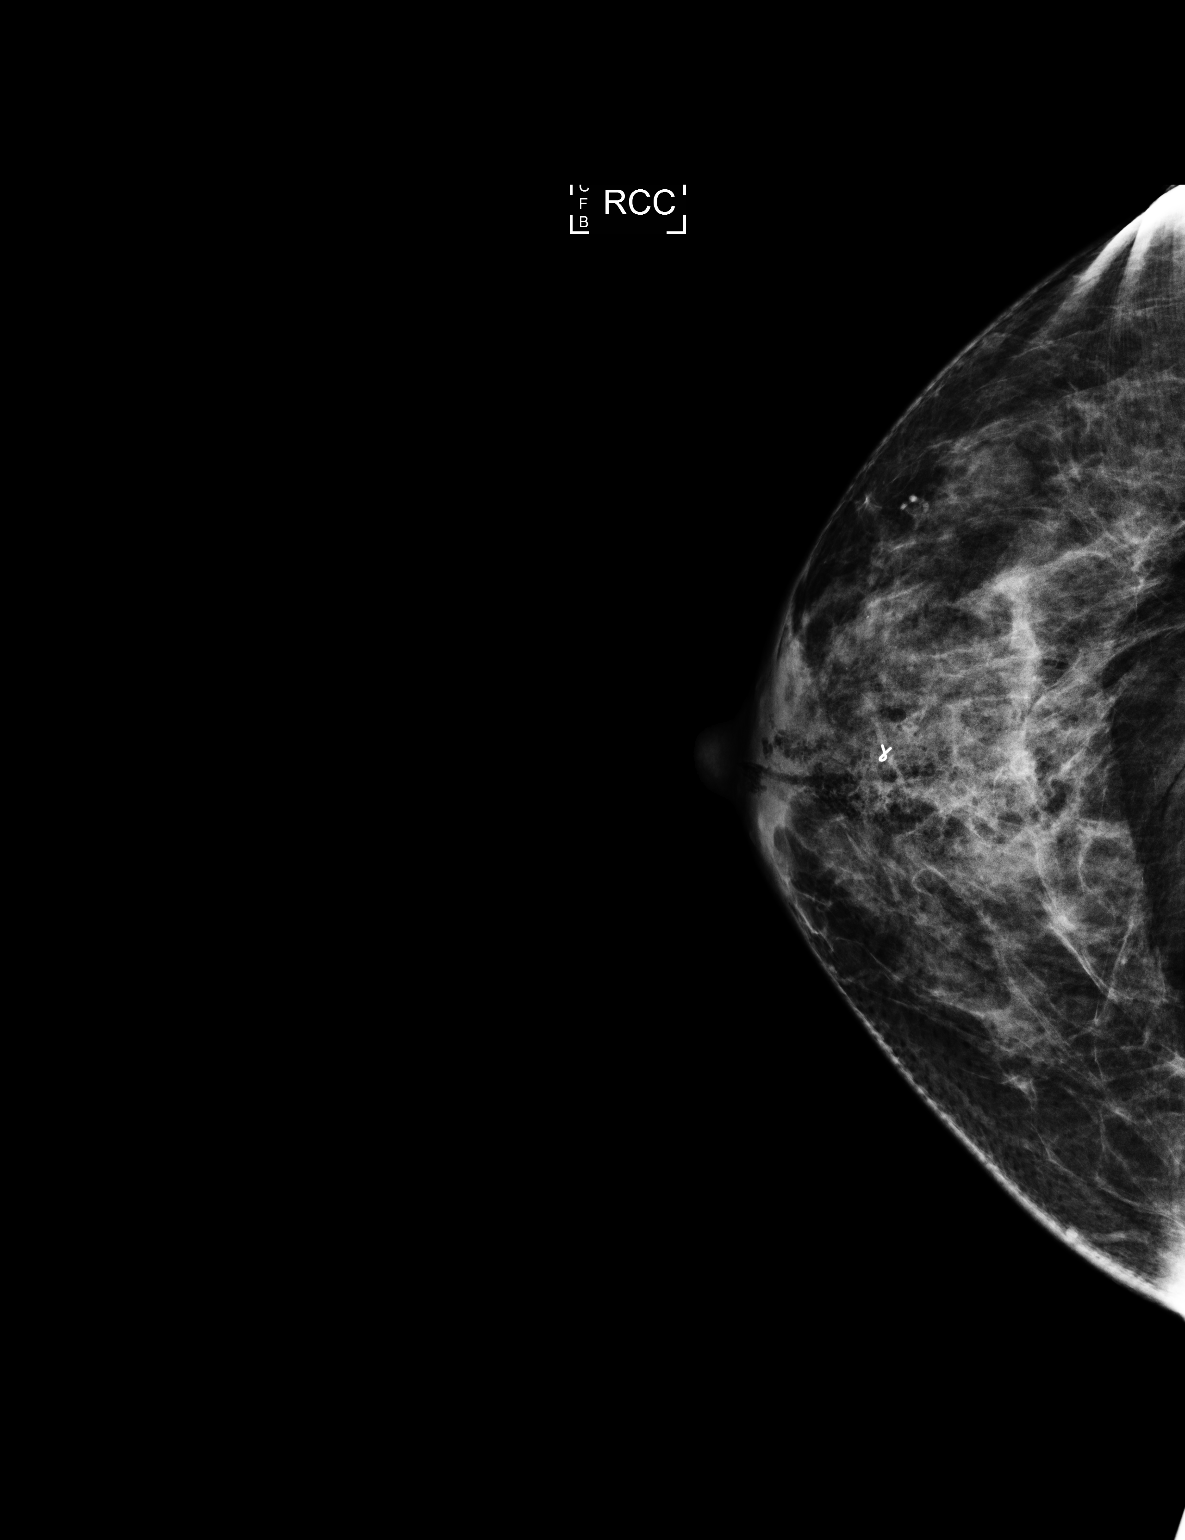

[2 of 2 positions shown; findings below may reference images not displayed]

FINDINGS: Mammographic images were obtained following ultrasound guided biopsy
of mass in the retroareolar region of the right breast. A ribbon
shaped clip is identified in the retroareolar region of the right
breast in the expected location following biopsy.
IMPRESSION: Tissue marker clip is in expected location after biopsy.

Final Assessment: Post Procedure Mammograms for Marker Placement

## 2015-10-31 NOTE — Telephone Encounter (Signed)
Unable to contact Pt  Voice mail not set up yet

## 2015-10-31 NOTE — Telephone Encounter (Signed)
Patient returned nurse phone call.

## 2015-10-31 NOTE — Telephone Encounter (Signed)
-----   Message from Boykin Nearing, MD sent at 10/24/2015 12:21 PM EDT ----- Thyroid function test are all normal, her she is not hyperthyroid  Patient asked to return for U preg, if negative  Gyn referral will be placed for patient menstrual irregularities

## 2015-12-03 DIAGNOSIS — N951 Menopausal and female climacteric states: Secondary | ICD-10-CM | POA: Insufficient documentation

## 2015-12-03 NOTE — Telephone Encounter (Signed)
Pt here in our clinic for Lab results  Date of birth verified by pt Lab results given  Thyroids function test normal, Not hyperthyroid  Unable to performed U preg, no testing supplied  Pt will return on Tuesday. Aware GYN referral  Information given in Spanish

## 2015-12-11 ENCOUNTER — Ambulatory Visit: Payer: Self-pay | Attending: Family Medicine

## 2015-12-13 ENCOUNTER — Encounter: Payer: Self-pay | Admitting: Obstetrics and Gynecology

## 2015-12-27 ENCOUNTER — Other Ambulatory Visit: Payer: Self-pay | Admitting: Family Medicine

## 2015-12-27 ENCOUNTER — Ambulatory Visit: Payer: Self-pay | Attending: Family Medicine

## 2015-12-27 DIAGNOSIS — N926 Irregular menstruation, unspecified: Secondary | ICD-10-CM

## 2015-12-27 DIAGNOSIS — N951 Menopausal and female climacteric states: Secondary | ICD-10-CM

## 2015-12-27 LAB — POCT URINE PREGNANCY: Preg Test, Ur: NEGATIVE

## 2015-12-27 NOTE — Progress Notes (Signed)
Pt here for UPT  UPT negative  Pt stated still with low abdominal pain and no menses

## 2015-12-27 NOTE — Addendum Note (Signed)
Addended by: Betti Cruz on: 12/27/2015 10:32 AM   Modules accepted: Orders

## 2016-01-30 ENCOUNTER — Encounter: Payer: Self-pay | Admitting: Internal Medicine

## 2016-01-30 ENCOUNTER — Ambulatory Visit (INDEPENDENT_AMBULATORY_CARE_PROVIDER_SITE_OTHER): Payer: Self-pay | Admitting: Internal Medicine

## 2016-01-30 VITALS — BP 104/74 | HR 75 | Ht 65.0 in | Wt 154.0 lb

## 2016-01-30 DIAGNOSIS — N926 Irregular menstruation, unspecified: Secondary | ICD-10-CM

## 2016-01-30 DIAGNOSIS — E0500 Thyrotoxicosis with diffuse goiter without thyrotoxic crisis or storm: Secondary | ICD-10-CM

## 2016-01-30 LAB — T3, FREE: T3, Free: 2.7 pg/mL (ref 2.3–4.2)

## 2016-01-30 LAB — TSH: TSH: 1.67 u[IU]/mL (ref 0.35–4.50)

## 2016-01-30 LAB — T4, FREE: Free T4: 0.78 ng/dL (ref 0.60–1.60)

## 2016-01-30 LAB — FOLLICLE STIMULATING HORMONE: FSH: 44.7 m[IU]/mL

## 2016-01-30 NOTE — Patient Instructions (Signed)
Please stop at the lab.  Please come back for a follow-up appointment in 4 months.   

## 2016-01-30 NOTE — Progress Notes (Signed)
Patient ID: Krystil Dantin, female   DOB: 08/31/1975, 40 y.o.   MRN: [id number]   HPI  Marielis Labelle is a 40 y.o.-year-old female, returning for f/u for Graves ds. Last visit 6 months ago. She is here accompanied by the [place] interpreter.  Reviewed hx: In 2015, she presented to see her PCP for high heart rate and pain in pelvis. TFTs were found to be abnormal. Repeated thyroid tests to month later, where again abnormal.  I reviewed pt's thyroid tests: Lab Results  Component Value Date   TSH 2.07 10/23/2015   TSH 1.63 08/02/2015   TSH 2.296 07/10/2015   TSH 1.24 05/02/2015   TSH 2.69 01/30/2015   TSH 1.00 09/27/2014   TSH <0.008* 07/03/2014   TSH <0.008* 04/24/2014   FREET4 1.0 10/23/2015   FREET4 0.86 08/02/2015   FREET4 0.87 07/10/2015   FREET4 0.68 05/02/2015   FREET4 0.76 01/30/2015   FREET4 0.54* 09/27/2014   FREET4 1.70 07/03/2014   FREET4 1.40 05/03/2014   FREET4 1.47 04/24/2014    She had high Graves antibodies: Component     Latest Ref Rng 06/30/2014  TSI     <140 % baseline 158 (H)   We suspected Graves ds. based on the presentation and the high thyroid antibodies, however we did not perform a thyroid uptake and scan, since cost was an issue for her.  We started MMI 5 mg 2x a day, but she had HAs and weight gain/bloating >> dropped the dose to 5 mg daily. As TFTs improved >> we decreased the MMI to 2.5 mg daily in 09/2014, and then stopped 04/2015.   TFTs remained normal after stopping MMI. However, she tells me that afterwards, she started to have irregular menses in 09/2015 + ovarian pain, and started HAs.  Pt denies feeling nodules in neck, no hoarseness, no dysphagia, no choking/no odynophagia, no SOB with lying down. She has some neck pressure lying down at night.  She c/o the following sx: - + some fatigue - no weight loss/gain - + heat intolerance, + increased sweating (change in smell) - no tremors - + anxiety - + HA - no palpitations - no  hyperdefecation  Pt does have a FH of thyroid ds: mother, aunts (hyper- and hypo-thyroidism). + FH of thyroid cancer in aunt. No h/o radiation tx to head or neck.  ROS: Constitutional: see HPI Eyes: no blurry vision, no xerophthalmia ENT: no Hoarseness, no  sore throat, no tinnitus, no hypoacusis Cardiovascular: + CP/SOB/palpitations/leg swelling Respiratory:no cough/SOB Gastrointestinal: no N/V/D/C/ heartburn Musculoskeletal: no muscle aches/joint aches Skin: no rashes Neurological: no tremors/numbness/tingling/dizziness, + HA  I reviewed pt's medications, allergies, PMH, social hx, family hx, and changes were documented in the history of present illness. Otherwise, unchanged from my initial visit note:  Past Medical History  Diagnosis Date  . Uterine fibroid 2012    Past Surgical History  Procedure Laterality Date  . Cesarean section  2010   . Breast cyst aspiration Right 2008    History   Social History  . Marital Status: Married    Spouse Name: N/A    Number of Children: 1  . Years of Education: 12    Occupational History  . Hair Stylist     Social History Main Topics  . Smoking status: Current Every Day Smoker - now 3 cigs a day-- 0.25 packs/day for 23 years    Types: Cigarettes  . Smokeless tobacco: Never Used  . Alcohol Use: No  . Drug Use:  No  . Sexual Activity: Yes    Birth Control/ Protection: None   Social History Narrative   Lives with husband, daughter and mother.    Current Outpatient Prescriptions on File Prior to Visit  Medication Sig Dispense Refill  . Ascorbic Acid (VITAMIN C) 100 MG tablet Take 100 mg by mouth daily. Reported on 01/30/2016    . Cholecalciferol (VITAMIN D-3 PO) Take 1 capsule by mouth 2 (two) times daily. Reported on 01/30/2016    . meloxicam (MOBIC) 7.5 MG tablet Take 1 tablet (7.5 mg total) by mouth daily. (Patient not taking: Reported on 10/23/2015) 14 tablet 0  . Multiple Vitamin (MULTIVITAMIN) tablet Take 1 tablet by mouth  daily. Reported on 01/30/2016     No current facility-administered medications on file prior to visit.   Allergies  Allergen Reactions  . Antihistamines, Diphenhydramine-Type Shortness Of Breath  . Penicillins Nausea And Vomiting  . Vicks Dayquil Cold & Flu [Dm-Phenylephrine-Acetaminophen] Shortness Of Breath   Family History  Problem Relation Age of Onset  . Cancer Mother 27    breast cancer, 2009   . Asthma Father   . Heart disease Father 12  . Diabetes Neg Hx    PE: BP 104/74 mmHg  Pulse 75  Ht 5\' 5"  (1.651 m)  Wt 154 lb (69.854 kg)  BMI 25.63 kg/m2  SpO2 99%  LMP 09/17/2015 Body mass index is 25.63 kg/(m^2). Wt Readings from Last 3 Encounters:  01/30/16 154 lb (69.854 kg)  10/23/15 153 lb (69.4 kg)  08/02/15 154 lb 9.6 oz (70.126 kg)   Constitutional: normal weight, in NAD Eyes: PERRLA, EOMI, no exophthalmos, no lid lag, no stare ENT: moist mucous membranes, no thyromegaly, no cervical lymphadenopathy Cardiovascular: RRR, No MRG Respiratory: CTA B Gastrointestinal: abdomen soft, NT, ND, BS+ Musculoskeletal: no deformities, strength intact in all 4 Skin: moist, warm, no rashes Neurological: no tremor with outstretched hands, DTR normal in all 4  ASSESSMENT: 1. Graves ds.  2. Irregular menses - initially light (02-09/2015), then spotting 2 days ago  PLAN:  1. Patient with Graves ds., initially with thyrotoxic sxs: weight loss, palpitations, anxiety. We suspected Graves (due to presentation and increased TSI antibodies), so we decided to bypass a thyroid uptake and scan due to finances. She was on MMI >> now stopped with persistent normal TFTs. She started to have lighter menses after stopping MMI and stopped in 09/2015. Spotting 2 days ago. TFTs were normal in 12/2015. Pregnancy test neg in 12/2015.  - we'll check her TSH, free T4, free T3 + TSIs today - no need to add beta blockers at this time, since she is not tachycardic, anxious, or tremulous - we will need to  call her through the Spanish interpreter line - call her directly at [phone]. - RTC in 4 months, but possibly sooner for repeat labs  2. Irregular menses - will add an [facility] - also see above  Philemon Kingdom, MD PhD [facility]  Office Visit on 01/30/2016  Component Date Value Ref Range Status  . [facility] 01/30/2016 44.7   Final   Female Reference Range:  1.4-18.1 mIU/mLFemale Reference Range:Follicular Phase          2.5-10.2 mIU/mLMidCycle Peak          3.4-33.4 mIU/mLLuteal Phase          1.5-9.1 mIU/mLPost Menopausal     23.0-116.3 mIU/mLPregnant          <0.3 mIU/mL  . TSH 01/30/2016 1.67  0.35 - 4.50  uIU/mL Final  . Free T4 01/30/2016 0.78  0.60 - 1.60 ng/dL Final  . T3, Free 01/30/2016 2.7  2.3 - 4.2 pg/mL Final   Patient's thyroid tests are normal. However, her [place] is high, and it apparently has been high a year ago also. She does appear to be menopausal at this point.  The thyroid antibodies are still pending, but regardless of the result, these would not change our management for now, in the context of normal TFTs.

## 2016-02-01 ENCOUNTER — Telehealth: Payer: Self-pay

## 2016-02-01 NOTE — Telephone Encounter (Signed)
-----   Message from Philemon Kingdom, MD sent at 01/31/2016  6:03 PM EDT ----- Almyra Free, see below. Also, she will need to be called through the Spanish interpreter line. Thank you so much, C  ----- Message -----    From: Philemon Kingdom, MD    Sent: 01/31/2016   6:00 PM      To: Caprice Beaver, LPN  Almyra Free, can you please call pt: Patient's thyroid tests are normal. However, her [facility] is high, and it apparently has been high a year ago also. She does appear to be menopausal at this point. The thyroid antibodies are still pending, but regardless of the result, these would not change our management for now, in the context of normal TFTs.

## 2016-02-01 NOTE — Telephone Encounter (Signed)
Called patient through interpreter line. Left message of normal results, and FSH level high; advised patient was in menopause at this time. Advised patient that if she had any questions to call us back. No changes needed to be made on treatment, and we would see her back at follow up appointment.

## 2016-02-05 LAB — THYROID STIMULATING IMMUNOGLOBULIN

## 2016-02-08 ENCOUNTER — Telehealth: Payer: Self-pay

## 2016-02-08 NOTE — Telephone Encounter (Signed)
Called and spoke with patient; gave lab results, asked if patient had questions she stated no.; advised patient to call back if any problems.

## 2016-03-05 ENCOUNTER — Encounter: Payer: Self-pay | Admitting: Obstetrics & Gynecology

## 2016-03-05 ENCOUNTER — Ambulatory Visit (INDEPENDENT_AMBULATORY_CARE_PROVIDER_SITE_OTHER): Payer: Self-pay | Admitting: Obstetrics & Gynecology

## 2016-03-05 VITALS — BP 118/50 | HR 81 | Wt 154.7 lb

## 2016-03-05 DIAGNOSIS — N898 Other specified noninflammatory disorders of vagina: Secondary | ICD-10-CM

## 2016-03-05 DIAGNOSIS — N938 Other specified abnormal uterine and vaginal bleeding: Secondary | ICD-10-CM

## 2016-03-05 MED ORDER — ESTRADIOL 1 MG PO TABS: 1.0000 mg | ORAL_TABLET | Freq: Every day | ORAL | 6 refills | Status: DC

## 2016-03-05 MED ORDER — MEDROXYPROGESTERONE ACETATE 2.5 MG PO TABS: 2.5000 mg | ORAL_TABLET | Freq: Every day | ORAL | 6 refills | Status: DC

## 2016-03-05 NOTE — Progress Notes (Signed)
Subjective:    Yvette Johnson is a 40 y.o. G1P1 female and new patient who presents for evaluation of oligomenorrhea and abdominal pain. Her last regular period was 3 months ago and the patient reports having a very light period at the beginning of this month (around 8/1).  She has had significant abdominal cramping and pain over the past 3 months.  Periods were regular in the past occurring every 1 month. Patient has no relevant history of abnormal sexual development. Is there a chance of pregnancy? no. HCG lab test done? no, not done.  The patient had a POCT urine pregnancy test on 12/27/15 and that was negative. Factors that may be contributory to menstrual abnormalities include: hormonal factors Peri-Menopausal. Previous treatments for menstrual abnormalities include: none.  The patient denies blood in her urine, changes in frequency or urgency.  She admits some burning with urination and discharge. Denies pain with intercourse.  Menstrual History: OB History    Gravida Para Term Preterm AB Living   1 1       1    SAB TAB Ectopic Multiple Live Births                  Active Ambulatory Problems    Diagnosis Date Noted  . Graves disease 04/24/2014  . Light cigarette smoker (1-9 cigs/day) 04/24/2014  . Pain, dental 07/10/2015  . Fibroadenoma of right breast in female 10/23/2015  . Impacted third molar tooth 10/23/2015  . Menopausal symptoms 12/03/2015   Resolved Ambulatory Problems    Diagnosis Date Noted  . Screening for cervical cancer 05/08/2014  . Vaginal yeast infection 05/09/2014   Past Medical History:  Diagnosis Date  . Uterine fibroid 2012     Menarche age: unknown Patient's last menstrual period was 02/12/2016 (approximate).    The following portions of the patient's history were reviewed and updated as appropriate: allergies, current medications, past family history, past social history and past surgical history.  Review of Systems Constitutional: negative for  anorexia, fevers, sweats and weight loss Respiratory: negative for cough and dyspnea on exertion Cardiovascular: negative for lower extremity edema, palpitations and syncope Gastrointestinal: positive for constipation, negative for diarrhea, nausea and vomiting Genitourinary:positive for abnormal menstrual periods and hot flashes Endocrine: positive for temperature intolerance    Objective:    BP (!) 118/50   Pulse 81   Wt 154 lb 11.2 oz (70.2 kg)   LMP 02/12/2016 (Approximate) Comment: spotting only x2 days  BMI 25.74 kg/m  General:   alert, cooperative and no distress  Skin:   no rash or abnormalities  Lungs:   clear to auscultation bilaterally and no wheezes or rales  Heart:   regular rate and rhythm, S1, S2 normal, no murmur, click, rub or gallop  Breasts:   not performed  Abdomen:  soft, non-tender; bowel sounds normal; no masses,  no organomegaly  Pelvis:  Vulva and vagina appear normal. Bimanual exam reveals normal uterus and adnexa. Vaginal: normal mucosa without prolapse or lesions and discharge, white and small amount Cervix: normal appearance and GC prep obtained Uterus: normal single, nontender   Lab Review GC/Chlamydia DNA probe of cervico/vaginal secretions and Wet mount of vaginal secretions    MDM The patient has oligomenorrhea and hot flashes consistent with a perimenopausal state.  A previous [place] value suggestive of early menopause is on file from the patient's endocrinologist.  Her abdominal pain is likely linked to the above perimenopausal symptoms but an [place] will be obtained to rule out  other causes     Assessment:    1. oligomenorrhea and associated symptoms suggestive of early menopause.   2. Abdominal pain    Plan:  1.  Progesterone and estradiol supplementation for menopausal symptoms  2. Transvaginal US to assess for anatomic cause of abdominal pain  3. RTC to review progress  Lajuana Matte  PA-S 03/05/16  16:53  Attestation of Attending  Supervision ofStudent: Evaluation and management procedures were performed by the student under my supervision and collaboration.  I have reviewed the student's note and chart, and I agree with the management and plan.  Emeterio Reeve, MD, [place] Attending [place], [facility]

## 2016-03-06 ENCOUNTER — Telehealth: Payer: Self-pay | Admitting: General Practice

## 2016-03-06 DIAGNOSIS — N760 Acute vaginitis: Principal | ICD-10-CM

## 2016-03-06 DIAGNOSIS — B9689 Other specified bacterial agents as the cause of diseases classified elsewhere: Secondary | ICD-10-CM

## 2016-03-06 LAB — WET PREP, GENITAL
TRICH WET PREP: NONE SEEN
Yeast Wet Prep HPF POC: NONE SEEN

## 2016-03-06 LAB — GC/CHLAMYDIA PROBE AMP (~~LOC~~) NOT AT ARMC
CHLAMYDIA, DNA PROBE: NEGATIVE
NEISSERIA GONORRHEA: NEGATIVE

## 2016-03-06 MED ORDER — METRONIDAZOLE 500 MG PO TABS: 500.0000 mg | ORAL_TABLET | Freq: Two times a day (BID) | ORAL | 0 refills | Status: DC

## 2016-03-06 NOTE — Telephone Encounter (Signed)
Patient called back into front office returning our call. Spoke with patient through [employer] for interpreter & informed patient of results & medication to pharmacy. Patient verbalized understanding & had no questions

## 2016-03-06 NOTE — Telephone Encounter (Signed)
Per Dr Roselie Awkward, patient has BV & flagyl is to be sent to pharmacy. Med ordered. Called patient with [employer] for interpreter, no answer on mobile number & VM box has not been set up. Called patient at home number and left message to call us back for results.

## 2016-03-10 ENCOUNTER — Ambulatory Visit (HOSPITAL_COMMUNITY)
Admission: RE | Admit: 2016-03-10 | Discharge: 2016-03-10 | Disposition: A | Discharge status: Home / Self Care | Payer: Self-pay | Source: Ambulatory Visit | Attending: Obstetrics & Gynecology | Admitting: Obstetrics & Gynecology

## 2016-03-10 DIAGNOSIS — N938 Other specified abnormal uterine and vaginal bleeding: Secondary | ICD-10-CM | POA: Insufficient documentation

## 2016-03-10 DIAGNOSIS — R52 Pain, unspecified: Secondary | ICD-10-CM | POA: Insufficient documentation

## 2016-03-11 ENCOUNTER — Telehealth: Payer: Self-pay

## 2016-03-11 NOTE — Telephone Encounter (Signed)
I attempted to call patient but there was no answer or voicemail to leave a message.  Normal [place], recommend f/u in 4-6 months

## 2016-03-13 NOTE — Telephone Encounter (Signed)
Patient informed by Lavon Paganini Spanish interpreter.

## 2016-06-02 ENCOUNTER — Ambulatory Visit: Payer: Self-pay | Admitting: Internal Medicine

## 2016-06-02 DIAGNOSIS — Z0289 Encounter for other administrative examinations: Secondary | ICD-10-CM

## 2016-12-24 ENCOUNTER — Other Ambulatory Visit: Payer: Self-pay | Admitting: Obstetrics and Gynecology

## 2016-12-24 DIAGNOSIS — Z1231 Encounter for screening mammogram for malignant neoplasm of breast: Secondary | ICD-10-CM

## 2017-01-01 ENCOUNTER — Encounter (HOSPITAL_COMMUNITY): Payer: Self-pay

## 2017-01-01 ENCOUNTER — Ambulatory Visit (HOSPITAL_COMMUNITY)
Admission: RE | Admit: 2017-01-01 | Discharge: 2017-01-01 | Disposition: A | Discharge status: Home / Self Care | Payer: Self-pay | Source: Ambulatory Visit | Attending: Obstetrics and Gynecology | Admitting: Obstetrics and Gynecology

## 2017-01-01 ENCOUNTER — Ambulatory Visit
Admission: RE | Admit: 2017-01-01 | Discharge: 2017-01-01 | Disposition: A | Discharge status: Home / Self Care | Payer: No Typology Code available for payment source | Source: Ambulatory Visit | Attending: Obstetrics and Gynecology | Admitting: Obstetrics and Gynecology

## 2017-01-01 VITALS — BP 104/72 | Ht 63.0 in | Wt 135.8 lb

## 2017-01-01 DIAGNOSIS — Z1239 Encounter for other screening for malignant neoplasm of breast: Secondary | ICD-10-CM

## 2017-01-01 DIAGNOSIS — Z1231 Encounter for screening mammogram for malignant neoplasm of breast: Secondary | ICD-10-CM

## 2017-01-01 LAB — HM MAMMOGRAPHY: HM MAMMO: NORMAL (ref 0–4)

## 2017-01-01 NOTE — Addendum Note (Signed)
Encounter addended by: Loletta Parish, RN on: 01/01/2017 11:44 AM<BR>    Actions taken: Sign clinical note

## 2017-01-01 NOTE — Progress Notes (Addendum)
No complaints today.   Pap Smear: Pap smear not completed today. Last Pap smear was 05/08/2014 at [facility] and normal with negative HPV. Per patient has no history of an abnormal Pap smear. Last Pap smear result is in EPIC.  Physical exam: Breasts Breasts symmetrical. No skin abnormalities bilateral breasts. No nipple retraction bilateral breasts. No nipple discharge bilateral breasts. No lymphadenopathy. No lumps palpated bilateral breasts. No complaints of pain or tenderness on exam. Referred patient to the [facility] for a screening mammogram. Appointment scheduled for Thursday, January 01, 2017 at 0910.        Pelvic/Bimanual No Pap smear completed today since last Pap smear and HPV typing was 05/08/2014. Pap smear not indicated per BCCCP guidelines.   Smoking History: Patient is a former smoker that quit a few years ago.  Patient Navigation: Patient education provided. Access to services provided for patient through [facility] program. Spanish interpreter provided.  Used Spanish interpreter [employer] from [facility].

## 2017-01-01 NOTE — Patient Instructions (Addendum)
Explained breast self awareness with Yvette Johnson. Patient did not need a Pap smear today due to last Pap smear and HPV typing was 05/08/2014. Let her know BCCCP will cover Pap smears and HPV typing every 5 years unless has a history of abnormal Pap smears. Referred patient to the [facility] for a screening mammogram. Appointment scheduled for Thursday, January 01, 2017 at 0910 Let patient know the [facility] will follow up with her within the next couple weeks with results of mammogram by letter or phone. Yvette Johnson verbalized understanding.  Yvette Johnson, Arvil Chaco, RN 8:19 AM

## 2017-01-01 NOTE — Addendum Note (Signed)
Encounter addended by: Loletta Parish, RN on: 01/01/2017  9:06 AM<BR>    Actions taken: Sign clinical note

## 2017-01-02 ENCOUNTER — Encounter (HOSPITAL_COMMUNITY): Payer: Self-pay | Admitting: *Deleted

## 2017-02-18 ENCOUNTER — Encounter: Payer: Self-pay | Admitting: Physician Assistant

## 2017-02-18 ENCOUNTER — Ambulatory Visit (INDEPENDENT_AMBULATORY_CARE_PROVIDER_SITE_OTHER): Payer: Self-pay | Admitting: Physician Assistant

## 2017-02-18 ENCOUNTER — Ambulatory Visit (INDEPENDENT_AMBULATORY_CARE_PROVIDER_SITE_OTHER): Payer: Self-pay

## 2017-02-18 VITALS — BP 115/78 | HR 76 | Temp 98.1°F | Resp 18 | Ht 63.0 in | Wt 131.4 lb

## 2017-02-18 DIAGNOSIS — K5900 Constipation, unspecified: Secondary | ICD-10-CM

## 2017-02-18 DIAGNOSIS — R8299 Other abnormal findings in urine: Secondary | ICD-10-CM

## 2017-02-18 DIAGNOSIS — R1084 Generalized abdominal pain: Secondary | ICD-10-CM

## 2017-02-18 DIAGNOSIS — R82998 Other abnormal findings in urine: Secondary | ICD-10-CM

## 2017-02-18 LAB — POCT CBC
GRANULOCYTE PERCENT: 57.6 % (ref 37–80)
HCT, POC: 39 % (ref 37.7–47.9)
Hemoglobin: 13.3 g/dL (ref 12.2–16.2)
Lymph, poc: 2.1 (ref 0.6–3.4)
MCH: 31.3 pg — AB (ref 27–31.2)
MCHC: 34.1 g/dL (ref 31.8–35.4)
MCV: 91.5 fL (ref 80–97)
MID (cbc): 0.6 (ref 0–0.9)
MPV: 6.7 fL (ref 0–99.8)
POC Granulocyte: 3.6 (ref 2–6.9)
POC LYMPH PERCENT: 33.6 %L (ref 10–50)
POC MID %: 8.8 %M (ref 0–12)
Platelet Count, POC: 359 10*3/uL (ref 142–424)
RBC: 4.26 M/uL (ref 4.04–5.48)
RDW, POC: 13.1 %
WBC: 6.3 10*3/uL (ref 4.6–10.2)

## 2017-02-18 LAB — POCT URINALYSIS DIP (MANUAL ENTRY)
BILIRUBIN UA: NEGATIVE
Blood, UA: NEGATIVE
Glucose, UA: NEGATIVE mg/dL
Ketones, POC UA: NEGATIVE mg/dL
Nitrite, UA: NEGATIVE
PH UA: 8.5 — AB (ref 5.0–8.0)
Protein Ur, POC: NEGATIVE mg/dL
SPEC GRAV UA: 1.01 (ref 1.010–1.025)
Urobilinogen, UA: 0.2 E.U./dL

## 2017-02-18 LAB — POC HEMOCCULT BLD/STL (OFFICE/1-CARD/DIAGNOSTIC): Fecal Occult Blood, POC: POSITIVE — AB

## 2017-02-18 LAB — GLUCOSE, POCT (MANUAL RESULT ENTRY): POC GLUCOSE: 100 mg/dL — AB (ref 70–99)

## 2017-02-18 LAB — POCT URINE PREGNANCY: PREG TEST UR: NEGATIVE

## 2017-02-18 NOTE — Progress Notes (Signed)
Yvette Johnson  MRN: [id number] DOB: Jun 17, 1976  Subjective:   Yvette Johnson is a 41 y.o. female who presents for evaluation of abdominal pain. Onset was 1 month ago.  Symptoms have been unchanged.  Pain is located in the esophagus, LUQ and epigastric region without radiation. Aggravating factors: eating.  Alleviating factors: liquids and proton pump inhibitors. Associated symptoms: belching and constipation. She will occasionally see bright red blood on stool after straining really hard. The patient denies difficulty swalowing, pain with swallowing, arthralagias, chills, diarrhea, dysuria, fever, flatus, frequency, headache, hematuria, melena, myalgias, nausea, sweats and vomiting. Typically has a  bowel movement every day, but she has to strain a lot. Last BM was yesterday. Has tried nexium occassionally and this has helped but has not used it occasionally. Denies alcohol and excessive NSAID use. Saw an urgent care for this and they said she was dehydrated. Urgent care did CBC, CMP, A1C, and lipid panel, all of which was clinically normal.  She has changed her diet over the past month, cut out dairy, coffee, and spicy foods. She eats eggs, toast, papaya, cereal bar, and chicken. Will only eat a handful of spinach or broccoli occasionally in the week. Will sometimes eat an apple, but other than papaya, no other fruit.  She drinks about 40-64 oz of water a day. Of note, pt did try diet pills/powder about a couple months ago before this started and noticed these symptoms after. Pt has hx of thyroid disease, suspected Graves disease. She was started on MMI 40m  and stopped in medication about a year later. TFTs remained normal after stopping MMI. She has not followed up since 01/2016. Last TSH, fT4, and fT3 in 01/2016 were normal.   No FH of colon cancer. No prior colonoscopy. Pt is perimenopauasl. LMP > 6 months ago, she is sexually active.   Has no current PCP.   [employer] interpreter AFabio [phone]    Review of Systems  Constitutional: Positive for unexpected weight change (weight loss of 2-3 lbs over the past few months, has been trying to lose weight ). Negative for appetite change.  Genitourinary: Negative for decreased urine volume, flank pain and urgency.    Patient Active Problem List   Diagnosis Date Noted  . Menopausal symptoms 12/03/2015  . Fibroadenoma of right breast in female 10/23/2015  . Impacted third molar tooth 10/23/2015  . Pain, dental 07/10/2015  . Graves disease 04/24/2014  . Light cigarette smoker (1-9 cigs/day) 04/24/2014    Current Outpatient Prescriptions on File Prior to Visit  Medication Sig Dispense Refill  . Ascorbic Acid (VITAMIN C) 100 MG tablet Take 100 mg by mouth daily. Reported on 01/30/2016    . Cholecalciferol (VITAMIN D-3 PO) Take 1 capsule by mouth 2 (two) times daily. Reported on 01/30/2016    . Multiple Vitamin (MULTIVITAMIN) tablet Take 1 tablet by mouth daily. Reported on 01/30/2016    . estradiol (ESTRACE) 1 MG tablet Take 1 tablet (1 mg total) by mouth daily. (Patient not taking: Reported on 01/01/2017) 30 tablet 6  . medroxyPROGESTERone (PROVERA) 2.5 MG tablet Take 1 tablet (2.5 mg total) by mouth daily. (Patient not taking: Reported on 01/01/2017) 30 tablet 6  . meloxicam (MOBIC) 7.5 MG tablet Take 1 tablet (7.5 mg total) by mouth daily. (Patient not taking: Reported on 10/23/2015) 14 tablet 0  . metroNIDAZOLE (FLAGYL) 500 MG tablet Take 1 tablet (500 mg total) by mouth 2 (two) times daily. (Patient not taking: Reported on 01/01/2017) 14 tablet 0  .  SOY ISOFLAVONES PO Take by mouth.     No current facility-administered medications on file prior to visit.     Allergies  Allergen Reactions  . Antihistamines, Diphenhydramine-Type Shortness Of Breath  . Penicillins Nausea And Vomiting  . Vicks Dayquil Cold & Flu [Dm-Phenylephrine-Acetaminophen] Shortness Of Breath      Social History   Social History  . Marital status: Married     Spouse name: N/A  . Number of children: 1  . Years of education: 60    Occupational History  . Hair Stylist     Social History Main Topics  . Smoking status: Former Smoker    Packs/day: 0.25    Years: 23.00    Types: Cigarettes  . Smokeless tobacco: Never Used  . Alcohol use No  . Drug use: No  . Sexual activity: Yes    Birth control/ protection: None   Other Topics Concern  . Not on file   Social History Narrative   Lives with husband, daughter and mother.           Objective:  BP 115/78 (BP Location: Right Arm, Patient Position: Sitting, Cuff Size: Normal)   Pulse 76   Temp 98.1 F (36.7 C) (Oral)   Resp 18   Ht _0  (1.6 m)   Wt 131 lb 6.4 oz (59.6 kg)   SpO2 100%   BMI 23.28 kg/m   Physical Exam  Constitutional: She is oriented to person, place, and time and well-developed, well-nourished, and in no distress.  HENT:  Head: Normocephalic and atraumatic.  Eyes: Conjunctivae are normal.  Neck: Normal range of motion.  Cardiovascular: Normal rate, regular rhythm, normal heart sounds and intact distal pulses.   Pulmonary/Chest: Effort normal.  Abdominal: Soft. Normal appearance and bowel sounds are normal. She exhibits no distension. There is no tenderness. There is no rigidity, no guarding, no tenderness at McBurney's point and negative Murphy's sign.  Genitourinary: Rectal exam shows no external hemorrhoid, no internal hemorrhoid and no fissure.  Genitourinary Comments: Hard stool palpated in rectal vault, not impacted.   Neurological: She is alert and oriented to person, place, and time. Gait normal.  Skin: Skin is warm and dry.  Psychiatric: Affect normal.  Vitals reviewed.  Results for orders placed or performed in visit on 02/18/17 (from the past 24 hour(s))  POCT urinalysis dipstick     Status: Abnormal   Collection Time: 02/18/17  9:18 AM  Result Value Ref Range   Color, UA yellow yellow   Clarity, UA clear clear   Glucose, UA negative negative  mg/dL   Bilirubin, UA negative negative   Ketones, POC UA negative negative mg/dL   Spec Grav, UA 1.010 1.010 - 1.025   Blood, UA negative negative   pH, UA 8.5 (A) 5.0 - 8.0   Protein Ur, POC negative negative mg/dL   Urobilinogen, UA 0.2 0.2 or 1.0 E.U./dL   Nitrite, UA Negative Negative   Leukocytes, UA Small (1+) (A) Negative  POCT urine pregnancy     Status: None   Collection Time: 02/18/17  9:18 AM  Result Value Ref Range   Preg Test, Ur Negative Negative  POC Hemoccult Bld/Stl (1-Cd Office Dx)     Status: Abnormal   Collection Time: 02/18/17  9:21 AM  Result Value Ref Range   Card #1 Date 02-18-17    Fecal Occult Blood, POC Positive (A) Negative  POCT glucose (manual entry)     Status: Abnormal   Collection Time: 02/18/17  9:24 AM  Result Value Ref Range   POC Glucose 100 (A) 70 - 99 mg/dl  POCT CBC     Status: Abnormal   Collection Time: 02/18/17  9:25 AM  Result Value Ref Range   WBC 6.3 4.6 - 10.2 K/uL   Lymph, poc 2.1 0.6 - 3.4   POC LYMPH PERCENT 33.6 10 - 50 %L   MID (cbc) 0.6 0 - 0.9   POC MID % 8.8 0 - 12 %M   POC Granulocyte 3.6 2 - 6.9   Granulocyte percent 57.6 37 - 80 %G   RBC 4.26 4.04 - 5.48 M/uL   Hemoglobin 13.3 12.2 - 16.2 g/dL   HCT, POC 39.0 37.7 - 47.9 %   MCV 91.5 80 - 97 fL   MCH, POC 31.3 (A) 27 - 31.2 pg   MCHC 34.1 31.8 - 35.4 g/dL   RDW, POC 13.1 %   Platelet Count, POC 359 142 - 424 K/uL   MPV 6.7 0 - 99.8 fL   Dg Abd 1 View  Result Date: 02/18/2017 CLINICAL DATA:  Abdominal discomfort for 1 month, strained at bowel movement, constipation EXAM: ABDOMEN - 1 VIEW COMPARISON:  None FINDINGS: Increased stool throughout colon and rectum. Nonobstructive bowel gas pattern. No bowel dilatation or bowel wall thickening. Question tiny nonobstructing LEFT renal calculus versus bowel artifact. Osseous structures unremarkable. IMPRESSION: Increased stool throughout colon and rectum. Electronically Signed   By: Lavonia Dana M.D.   On: 02/18/2017 09:43     Wt Readings from Last 3 Encounters:  02/18/17 131 lb 6.4 oz (59.6 kg)  01/01/17 135 lb 12.8 oz (61.6 kg)  03/05/16 154 lb 11.2 oz (70.2 kg)    Assessment and Plan :   1. Generalized abdominal pain Labs pending. This is likely due to stool burden from constipation. Hx also sounds suspicious for GERD. She has avoided all triggering foods. Recommended to continue nexium daily for the next 4 weeks. For constipation, she was given instructions on how to treat this with OTC medications. Instructed she will likely need to use an enema. Given education on how to avoid constipation in the future after this resolves. Her hemoccult was positive today which could be due to potential hemorrhoids. Will recheck this in 4 weeks after constipation resolves, Hgb is stable. Plan for follow up in 4 weeks or sooner if symptoms worsen.  - POCT urinalysis dipstick - POCT CBC - POCT glucose (manual entry) - DG Abd 1 View; Future - CMP14+EGFR - TSH - POCT urine pregnancy - POC Hemoccult Bld/Stl (1-Cd Office Dx)  2. Constipation, unspecified constipation type - POC Hemoccult Bld/Stl (1-Cd Office Dx)  3. Leukocytes in urine Asymptomatic today. Urine culture pending. Instructed to contact our office if she develops any dysuria, urinary frequency, or hesitancy.  - Urine Culture  Tenna Delaine, PA-C  [facility] [place] 02/18/2017 2:10 PM

## 2017-02-18 NOTE — Patient Instructions (Addendum)
The blood work we did in office today was all normal. The xray did show increased stool throughout the colon suggesting constipation.  The fecal occult card did come back positive for blood. After you constipation has been treated, please return to office in about 4 weeks for a repeat test.  You urine did have some leukocytes, which could suggest an early urinary tract infection. If you develop any urinary frequency, burning when your peeing, or feeling as if you are not emptying your bladder, please return for reevaluation.We will contact you within a week to discuss the other labs from today that have been sent off. Below is information to treat constipation. Please take it to the pharmacy and have them show you where to pick up an enema to treat your constipation. Thank you for letting me participate in your health and well being.  For your constipation, we'll need to try several ways to treat this:  1)  First, take a stool softener twice daily for the next week.  After that, just take it once daily. 2) Take either Miralax or prune juice once in the AM and once in the PM for the next several days.  Stop taking it if you have diarrhea.  You can increase this to 3 times a day to help you go as well. **3) If you still haven't gone to the restroom after three days, it's time to try either a suppository or an enema.  This should make you go. 4) Continue to take the stool softener and Miralax, even if you use the suppository.  Your goal is to have a soft bowel movement once daily. Once you start going to the bathroom, cut back to once or twice daily Miralax/prune until you achieve that goal.    In the future, to help reduce constipation and promote bowel health: 1. Drink at least 64 ounces of water each day 2. Eat plenty of fiber (fruits, vegetables, whole grains, legumes) 3. Be physically active or exercise including walking, jogging, swimming, yoga, etc. 4. For active constipation use a stool  softener (docusate) or an osmotic laxative (like Miralax) each day, or as needed    El trabajo de sangre que hicimos en la oficina hoy fue todo normal. La radiografa mostr aumento de las heces en todo el colon, lo que sugiere estreimiento. La tarjeta de heces fue positiva para la sangre. Despus de que se haya tratado su estreimiento, regrese a la oficina en aproximadamente 4 semanas para una nueva prueba. Su orina tiene [employer], lo que podra sugerir una infeccin temprana del tracto urinario. Si desarrolla cualquier frecuencia urinaria, ardor al orinar o una sensacin de no vaciar la vejiga, regrese para una nueva evaluacin. Nos comunicaremos con usted dentro de una semana para [employer] otros laboratorios desde hoy que fueron enviados. A continuacin hay informacin para tratar el estreimiento. Llvelo a la farmacia y solicite que le muestren dnde buscar un enema para tratar su estreimiento. Gracias por permitirme participar en tu salud y Therapist, sports.   Para su estreimiento, tendremos que probar varias formas de tratar esto:  1) Primero, tome un ablandador de [employer] al da durante la prxima semana. Despus de eso, tmalo una vez al [employer]. 2) Tome ya sea Miralax o jugo de ciruela una vez en la maana y una vez en la tarde durante los prximos [place]. Deje de tomarlo si tiene diarrea. Puede aumentar esto a 3 veces por da para ayudarlo a ir tambin. **3) Si an  no ha ido al bao despus de tres das, es hora de probar un supositorio o un enema. Esto debera hacerte ir. 4) Contine tomando el ablandador de heces y Miralax, incluso si [place] el supositorio.  Tu objetivo es tener una evacuacin intestinal suave una vez al da. Una vez que comience a ir al bao, reduzca a una o dos veces al da Miralax / Conservation officer, historic buildings que logre ese objetivo.  En el futuro, para ayudar a Production assistant, radio y promover la salud intestinal: 1. Beba al menos 62 onzas de agua por da 2. Coma  mucha fibra (frutas, verduras, granos enteros, legumbres) 3. Mantngase fsicamente activo o haciendo ejercicio, incluyendo caminar, trotar, Social worker, practicar yoga, etc. 4. Para el estreimiento activo, use un ablandador fecal (docusate) o un laxante osmtico (como Miralax) todos los [place], o segn sea necesario.   Estreimiento - Adultos (Constipation, Adult) Se llama constipacin cuando:  Elimina heces (defeca) menos de 3 veces por semana.  Tiene dificultad para defecar.  Las heces son secas y duras o son ms grandes que lo normal. CUIDADOS EN EL HOGAR  Consuma alimentos con alto contenido de [place]. Por ejemplo, frutas, vegetales, porotos y cereales integrales, como el arroz integral.  Evite los alimentos ricos en grasas y Location manager. Estos incluyen patatas fritas, hamburguesas, galletas, dulces y refrescos.  Si no consume suficientes alimentos ricos en fibras, tome productos que tengan agregado de fibra (suplementos).  Beba suficiente lquido para mantener el pis (orina) claro o de color amarillo plido.  Haga ejercicio en forma regular, o como lo indique su mdico.  Vaya al bao cuando sienta la necesidad de Landscape architect. No se aguante las ganas.  Solo tome los medicamentos que le haya indicado su mdico. No tome medicamentos que le ayuden a Landscape architect (laxantes) sin antes consultarlo con su mdico.  SOLICITE AYUDA DE INMEDIATO SI:  Observa sangre brillante en las heces (materia fecal).  El estreimiento dura ms de 4 das o [place].  Tiene dolor en el vientre (abdominal) o el trasero (recto).  Las heces son delgadas (como un lpiz).  Pierde peso de [place] inexplicable.  ASEGRESE DE QUE:  Comprende estas instrucciones.  Controlar su afeccin.  Recibir ayuda de inmediato si no mejora o si empeora.  Esta informacin no tiene Marine scientist el consejo del mdico. Asegrese de hacerle al mdico cualquier pregunta que tenga. Document Released: 08/02/2010 Document Revised:  07/21/2014 Document Reviewed: 12/19/2015 Elsevier Interactive Patient Education  2017 Elsevier Inc.   Constipacin en los adultos (Constipation, Adult) Constipacin significa que una persona defeca en una semana menos que lo normal, hay dificultad para defecar, o las heces son secas, duras, o ms grandes que lo normal. La causa puede ser una afeccin subyacente. Puede empeorar con la edad si una persona toma ciertos medicamentos y no toma suficiente lquido. INSTRUCCIONES PARA EL CUIDADO EN EL HOGAR Comida y bebida  Consuma alimentos con alto contenido de [place], como frutas y verduras frescas, cereales integrales y frijoles.  Limite los alimentos ricos en grasas y con bajo contenido de fibra, o muy procesados, como las papas fritas, [place], [place], dulces y refrescos.  Beba suficiente lquido para Consulting civil engineer orina clara o de color amarillo plido. Instrucciones generales  Haga actividad fsica habitualmente o como se lo haya indicado el mdico.  Vaya al bao cuando sienta la necesidad de ir. No se aguante las ganas.  Tome los medicamentos de venta libre y los recetados solamente como se lo haya indicado el mdico. Estos incluyen  los suplementos de [place].  Practique ejercicios de rehabilitacin del suelo plvico, como la respiracin profunda mientras relaja la parte inferior del abdomen y relajacin del suelo plvico mientras defeca.  Controle su afeccin para ver si hay cambios.  Concurra a todas las visitas de control como se lo haya indicado el mdico. Esto es importante. SOLICITE ATENCIN MDICA SI:  Siente un dolor que empeora.  Tiene fiebre.  No defeca despus de 4das.  Vomita.  No tiene hambre.  Pierde peso.  Tiene una hemorragia en el ano.  Las heces son delgadas como un lpiz.  SOLICITE ATENCIN MDICA DE INMEDIATO SI:  Tiene fiebre y los sntomas empeoran repentinamente.  Observa que se filtran heces o hay sangre en las heces.  Tiene el abdomen  hinchado.  Siente un dolor intenso en el abdomen.  Se siente mareado o se desmaya.  Esta informacin no tiene Marine scientist el consejo del mdico. Asegrese de hacerle al mdico cualquier pregunta que tenga. Document Released: 07/20/2007 Document Revised: 07/21/2014 Document Reviewed: 12/19/2015 Elsevier Interactive Patient Education  2017 [employer].   IF you received an x-ray today, you will receive an invoice from [facility]. Please contact [facility] at [phone] with questions or concerns regarding your invoice.   IF you received labwork today, you will receive an invoice from [facility]. Please contact [facility] at [phone] with questions or concerns regarding your invoice.   Our billing staff will not be able to assist you with questions regarding bills from these companies.  You will be contacted with the lab results as soon as they are available. The fastest way to get your results is to activate your My Chart account. Instructions are located on the last page of this paperwork. If you have not heard from [place] regarding the results in 2 weeks, please contact this office.

## 2017-02-19 LAB — CMP14+EGFR
A/G RATIO: 1.9 (ref 1.2–2.2)
ALT: 17 IU/L (ref 0–32)
AST: 17 IU/L (ref 0–40)
Albumin: 4.6 g/dL (ref 3.5–5.5)
Alkaline Phosphatase: 69 IU/L (ref 39–117)
BILIRUBIN TOTAL: 0.3 mg/dL (ref 0.0–1.2)
BUN / CREAT RATIO: 14 (ref 9–23)
BUN: 10 mg/dL (ref 6–24)
CALCIUM: 9.7 mg/dL (ref 8.7–10.2)
CHLORIDE: 103 mmol/L (ref 96–106)
CO2: 25 mmol/L (ref 20–29)
Creatinine, Ser: 0.73 mg/dL (ref 0.57–1.00)
GFR, EST AFRICAN AMERICAN: 119 mL/min/{1.73_m2} (ref >59)
GFR, EST NON AFRICAN AMERICAN: 103 mL/min/{1.73_m2} (ref >59)
GLOBULIN, TOTAL: 2.4 g/dL (ref 1.5–4.5)
Glucose: 94 mg/dL (ref 65–99)
POTASSIUM: 3.9 mmol/L (ref 3.5–5.2)
Sodium: 143 mmol/L (ref 134–144)
Total Protein: 7 g/dL (ref 6.0–8.5)

## 2017-02-19 LAB — URINE CULTURE

## 2017-02-19 LAB — TSH: TSH: 3.73 u[IU]/mL (ref 0.450–4.500)

## 2017-03-24 ENCOUNTER — Ambulatory Visit: Payer: Self-pay | Admitting: Physician Assistant

## 2017-04-06 ENCOUNTER — Encounter: Payer: Self-pay | Admitting: Internal Medicine

## 2017-04-07 ENCOUNTER — Encounter: Payer: Self-pay | Admitting: *Deleted

## 2017-04-08 ENCOUNTER — Encounter: Payer: Self-pay | Admitting: Internal Medicine

## 2017-04-08 ENCOUNTER — Encounter (INDEPENDENT_AMBULATORY_CARE_PROVIDER_SITE_OTHER): Payer: Self-pay

## 2017-04-08 ENCOUNTER — Ambulatory Visit (INDEPENDENT_AMBULATORY_CARE_PROVIDER_SITE_OTHER): Payer: Self-pay | Admitting: Internal Medicine

## 2017-04-08 VITALS — BP 98/66 | HR 72 | Ht 64.0 in | Wt 133.1 lb

## 2017-04-08 DIAGNOSIS — K5900 Constipation, unspecified: Secondary | ICD-10-CM

## 2017-04-08 DIAGNOSIS — R1012 Left upper quadrant pain: Secondary | ICD-10-CM

## 2017-04-08 DIAGNOSIS — R0989 Other specified symptoms and signs involving the circulatory and respiratory systems: Secondary | ICD-10-CM

## 2017-04-08 DIAGNOSIS — K219 Gastro-esophageal reflux disease without esophagitis: Secondary | ICD-10-CM

## 2017-04-08 DIAGNOSIS — R634 Abnormal weight loss: Secondary | ICD-10-CM

## 2017-04-08 DIAGNOSIS — K625 Hemorrhage of anus and rectum: Secondary | ICD-10-CM

## 2017-04-08 DIAGNOSIS — F458 Other somatoform disorders: Secondary | ICD-10-CM

## 2017-04-08 MED ORDER — SUPREP BOWEL PREP KIT 17.5-3.13-1.6 GM/177ML PO SOLN: 1.0000 | ORAL | 0 refills | Status: DC

## 2017-04-08 MED ORDER — PANTOPRAZOLE SODIUM 40 MG PO TBEC: DELAYED_RELEASE_TABLET | ORAL | 1 refills | Status: DC

## 2017-04-08 NOTE — Patient Instructions (Signed)
You have been scheduled for an endoscopy and colonoscopy. Please follow the written instructions given to you at your visit today. Please pick up your prep supplies at the pharmacy within the next 1-3 days. If you use inhalers (even only as needed), please bring them with you on the day of your procedure. Your physician has requested that you go to www.startemmi.com and enter the access code given to you at your visit today. This web site gives a general overview about your procedure. However, you should still follow specific instructions given to you by our office regarding your preparation for the procedure.  We have sent the following medications to your pharmacy for you to pick up at your convenience: Pantoprazole 40 mg twice daily before meals x 2 weeks, then decrease to once daily thereafter  Continue Miralax (over the counter) 17 grams once daily   If you are age 40 or older, your body mass index should be between 23-30. Your Body mass index is 22.85 kg/m. If this is out of the aforementioned range listed, please consider follow up with your Primary Care Provider.  If you are age 81 or younger, your body mass index should be between 19-25. Your Body mass index is 22.85 kg/m. If this is out of the aformentioned range listed, please consider follow up with your Primary Care Provider.  _______________________________________________________________________________________________   Yvette Johnson Stanford programado una endoscopia y Mallory colonoscopia. Siga las instrucciones escritas que le hemos dado en su visita de hoy.  Por favor, recoja sus suministros de preparacin en la farmacia dentro de los prximos 1-3 das.  Si [place] inhaladores (incluso solo si es necesario), llvelos consigo el da del procedimiento.  Su mdico le ha solicitado que vaya a www.startemmi.com e ingrese el cdigo de acceso que se le entreg en su visita de hoy. Este sitio web brinda una descripcin general sobre su procedimiento. Sin  embargo, an Advice worker las instrucciones especficas que le proporcione nuestra oficina con respecto a su preparacin para el procedimiento.  Hemos enviado los siguientes medicamentos a su farmacia para que los recoja a su conveniencia:  Pantoprazol 40 mg dos veces al da antes de las comidas x 2 semanas, luego disminuya a una vez al da a partir de entonces   Continuar con Miralax (sin receta) 17 gramos una vez al da   Si tiene 65 aos o ms, su ndice de masa corporal debera estar entre 23-30. Su ndice de masa corporal es 22.85 kg / m. Si esto est fuera del rango mencionado anteriormente, considere Optometrist un seguimiento con su Proveedor de [employer].   Si tienes 64 aos o menos, tu ndice de [employer] corporal debera estar entre 19-25. Su ndice de masa corporal es 22.85 kg / m. Si esto est fuera del rango mencionado mencionado, considere Optometrist un seguimiento con su Proveedor de [employer].

## 2017-04-08 NOTE — Progress Notes (Signed)
Patient ID: Onesha Krebbs, female   DOB: Apr 10, 1976, 41 y.o.   MRN: [id number] HPI: Adalaide Jaskolski is a 41 year old female with a past medical history of hyperthyroidism briefly on methimazole but now with normal thyroid function testing, uterine fibroids who seen in consultation at the request of Tenna Delaine, PA-C to evaluate globus sensation, epigastric and left-sided abdominal pain, constipation and rectal bleeding. She is here today with a friend at a medical Spanish interpreter. She is from [place].  She reports over the last 3 months having developed near constant globus sensation but also epigastric and left-sided abdominal pain. This is definitively worse with eating. She has avoided eating at times due to this pain and has lost about 8 pounds. She is tried Nexium 20 mg over-the-counter as well as Tums but this has not improved her symptoms.She feels that the Tums have worsened her constipation. She denies dysphagia and odynophagia. She denies nausea and vomiting. She denies NSAID use.  She does occasionally fill heartburn no heartburn has not been her predominant symptom.  She does have abdominal bloating worse after eating.  Over the last 2 years she's been dealing with constipation and using MiraLAX 17 g daily. This has helped improve the constipation. She's also taking a probiotic with digestive enzymes. However for the last 3 months she's noticed blood with an on her stool. There some blood with wiping. Occasionally she can have a burning pain with passing stool. Blood appears fresh and usually red. She denies melena or passing blood clot. Often stools are associated with left-sided abdominal discomfort.  She was seen by primary care and her stools were noted to be heme positive. Rectal exam performed by primary care in August 2018 did not reveal obvious internal or external lhemorrhoids or fissure.  She works as a Theatre manager. Her only abdominal surgery is prior cesarean section. Her  mother had breast cancer. Her father had heart disease. No history of IBD or GI tract malignancy. She used tobacco remotely, but none now. No alcohol. No illicit drug use. She did live in [place] prior to the [place].  She had an abdominal plain view x-ray taken on 02/18/2017 which showed increased stool throughout the colon and rectum  Past Medical History:  Diagnosis Date  . Constipation   . Thyroid disease   . Uterine fibroid 2012     Past Surgical History:  Procedure Laterality Date  . BREAST CYST ASPIRATION Right 2008   . CESAREAN SECTION  2010     Outpatient Medications Prior to Visit  Medication Sig Dispense Refill  . SOY ISOFLAVONES PO Take by mouth.    . Ascorbic Acid (VITAMIN C) 100 MG tablet Take 100 mg by mouth daily. Reported on 01/30/2016    . Cholecalciferol (VITAMIN D-3 PO) Take 1 capsule by mouth 2 (two) times daily. Reported on 01/30/2016    . estradiol (ESTRACE) 1 MG tablet Take 1 tablet (1 mg total) by mouth daily. (Patient not taking: Reported on 01/01/2017) 30 tablet 6  . medroxyPROGESTERone (PROVERA) 2.5 MG tablet Take 1 tablet (2.5 mg total) by mouth daily. (Patient not taking: Reported on 01/01/2017) 30 tablet 6  . meloxicam (MOBIC) 7.5 MG tablet Take 1 tablet (7.5 mg total) by mouth daily. (Patient not taking: Reported on 10/23/2015) 14 tablet 0  . metroNIDAZOLE (FLAGYL) 500 MG tablet Take 1 tablet (500 mg total) by mouth 2 (two) times daily. (Patient not taking: Reported on 01/01/2017) 14 tablet 0  . Multiple Vitamin (MULTIVITAMIN) tablet Take 1  tablet by mouth daily. Reported on 01/30/2016     No facility-administered medications prior to visit.     Allergies  Allergen Reactions  . Antihistamines, Diphenhydramine-Type Shortness Of Breath  . Penicillins Nausea And Vomiting  . Vicks Dayquil Cold & Flu [Dm-Phenylephrine-Acetaminophen] Shortness Of Breath    Family History  Problem Relation Age of Onset  . Breast cancer Mother   . Asthma Father   .  Heart disease Father 38  . Diabetes Neg Hx     Social History  Substance Use Topics  . Smoking status: Former Smoker    Packs/day: 0.25    Years: 23.00    Types: Cigarettes  . Smokeless tobacco: Never Used  . Alcohol use No    ROS: As per history of present illness, otherwise negative  BP 98/66   Pulse 72   Ht 5\' 4"  (1.626 m)   Wt 133 lb 2 oz (60.4 kg)   BMI 22.85 kg/m  Constitutional: Well-developed and well-nourished. No distress. HEENT: Normocephalic and atraumatic. Oropharynx is clear and moist. Conjunctivae are normal.  No scleral icterus. Neck: Neck supple. Trachea midline. Cardiovascular: Normal rate, regular rhythm and intact distal pulses. No M/R/G Pulmonary/chest: Effort normal and breath sounds normal. No wheezing, rales or rhonchi. Abdominal: Soft, nontender, nondistended. Bowel sounds active throughout. There are no masses palpable. No hepatosplenomegaly. Extremities: no clubbing, cyanosis, or edema Neurological: Alert and oriented to person place and time. Skin: Skin is warm and dry.  Psychiatric: Normal mood and affect. Behavior is normal.  RELEVANT LABS AND IMAGING: CBC    Component Value Date/Time   WBC 6.3 02/18/2017 0925   WBC 6.1 10/23/2015 1149   RBC 4.26 02/18/2017 0925   RBC 4.09 10/23/2015 1149   HGB 13.3 02/18/2017 0925   HGB 12.8 10/23/2015 1149   HCT 39.0 02/18/2017 0925   HCT 39.3 10/23/2015 1149   PLT 276 10/23/2015 1149   MCV 91.5 02/18/2017 0925   MCH 31.3 (A) 02/18/2017 0925   MCH 31.3 10/23/2015 1149   MCHC 34.1 02/18/2017 0925   MCHC 32.6 10/23/2015 1149   RDW 13.3 10/23/2015 1149    CMP     Component Value Date/Time   NA 143 02/18/2017 0914   K 3.9 02/18/2017 0914   CL 103 02/18/2017 0914   CO2 25 02/18/2017 0914   GLUCOSE 94 02/18/2017 0914   GLUCOSE 90 04/24/2014 1122   BUN 10 02/18/2017 0914   CREATININE 0.73 02/18/2017 0914   CREATININE 0.47 (L) 04/24/2014 1122   CALCIUM 9.7 02/18/2017 0914   PROT 7.0  02/18/2017 0914   ALBUMIN 4.6 02/18/2017 0914   AST 17 02/18/2017 0914   ALT 17 02/18/2017 0914   ALKPHOS 69 02/18/2017 0914   BILITOT 0.3 02/18/2017 0914   GFRNONAA 103 02/18/2017 0914   GFRNONAA >89 04/24/2014 1122   GFRAA 119 02/18/2017 0914   GFRAA >89 04/24/2014 1122    ASSESSMENT/PLAN: 41 year old female with a past medical history of hyperthyroidism briefly on methimazole but now with normal thyroid function testing, uterine fibroids who seen in consultation at the request of [place], PA-C to evaluate globus sensation, epigastric and left-sided abdominal pain, constipation and rectal bleeding.  1. Globus sensation/epigastric pain -- Symptoms seen most consistent with acid peptic disease. I recommended higher dose PPI and upper endoscopy. We discussed the risks, benefits and alternatives and she is agreeable and wishes to proceed. I will begin pantoprazole 40 mg twice a day before meals 2 weeks then once daily before  breakfast thereafter. Plan gastric biopsies to exclude H. Pylori at time of EGD  2. Left-sided abdominal pain/constipation/blood in stool/weight loss -- constipation has improved with MiraLAX but given her weight loss and blood in stool have recommended colonoscopy. We discussed the risk benefits and alternatives and she is agreeable and wishes to proceed. We'll need to exclude IBD.  Continue MiraLAX 17 g daily for now.   Cc: Tenna Delaine, PA-C Primary care at [facility]

## 2017-05-05 ENCOUNTER — Encounter: Payer: Self-pay | Admitting: Internal Medicine

## 2017-05-05 ENCOUNTER — Ambulatory Visit (AMBULATORY_SURGERY_CENTER): Payer: Self-pay | Admitting: Internal Medicine

## 2017-05-05 VITALS — BP 103/73 | HR 75 | Temp 99.3°F | Resp 15 | Ht 64.0 in | Wt 133.0 lb

## 2017-05-05 DIAGNOSIS — K2950 Unspecified chronic gastritis without bleeding: Secondary | ICD-10-CM

## 2017-05-05 DIAGNOSIS — K625 Hemorrhage of anus and rectum: Secondary | ICD-10-CM

## 2017-05-05 DIAGNOSIS — K5900 Constipation, unspecified: Secondary | ICD-10-CM

## 2017-05-05 DIAGNOSIS — R198 Other specified symptoms and signs involving the digestive system and abdomen: Secondary | ICD-10-CM

## 2017-05-05 DIAGNOSIS — R0989 Other specified symptoms and signs involving the circulatory and respiratory systems: Secondary | ICD-10-CM

## 2017-05-05 DIAGNOSIS — F458 Other somatoform disorders: Secondary | ICD-10-CM

## 2017-05-05 DIAGNOSIS — R1013 Epigastric pain: Secondary | ICD-10-CM

## 2017-05-05 DIAGNOSIS — R1012 Left upper quadrant pain: Secondary | ICD-10-CM

## 2017-05-05 MED ORDER — SODIUM CHLORIDE 0.9 % IV SOLN: 500.0000 mL | INTRAVENOUS | Status: DC

## 2017-05-05 NOTE — Progress Notes (Signed)
Interpreter did not show up for scheduled appointment. Yvette Johnson, Endo Tech interpreted for admitting and Yvette Johnson, [facility] interpreted for procedure and recover. Phone call was placed to Yvette Johnson interpreter who was scheduled and message left. Called Interpretive services at [phone] and left message explaining situation attempting to obtain an interpreter quickly. Called [phone] who arranges interpreters for [facility] from [place] who states that they do not have an interpreter named Yvette Johnson and did not have an appointment scheduled for this patient today. She is unable to assist with locating an interpreter who may be available.  Per Yvette Johnson, practice director, ok to use Yvette Johnson for interpretation for procedure and recovery as using Ipad would be too difficult to hear.  Yvette Johnson arrived for interpretation at 1410 pm.

## 2017-05-05 NOTE — Progress Notes (Signed)
Called to room to assist during endoscopic procedure.  Patient ID and intended procedure confirmed with present staff. Received instructions for my participation in the procedure from the performing physician.  

## 2017-05-05 NOTE — Progress Notes (Signed)
Report given to PACU, vss 

## 2017-05-05 NOTE — Op Note (Signed)
[facility] Patient Name: Yvette Johnson Procedure Date: 05/05/2017 1:49 PM MRN: [id number] Endoscopist: Jerene Bears , MD Age: 41 Referring MD:  Date of Birth: 1975-10-05 Gender: Female Account #: [id number] Procedure:                Colonoscopy Indications:              Abdominal pain in the left lower quadrant, Rectal                            bleeding, Change in bowel habits, Constipation Medicines:                Monitored Anesthesia Care Procedure:                Pre-Anesthesia Assessment:                           - Prior to the procedure, a History and Physical                            was performed, and patient medications and                            allergies were reviewed. The patient's tolerance of                            previous anesthesia was also reviewed. The risks                            and benefits of the procedure and the sedation                            options and risks were discussed with the patient.                            All questions were answered, and informed consent                            was obtained. Prior Anticoagulants: The patient has                            taken no previous anticoagulant or antiplatelet                            agents. ASA Grade Assessment: II - A patient with                            mild systemic disease. After reviewing the risks                            and benefits, the patient was deemed in                            satisfactory condition to undergo the procedure.  After obtaining informed consent, the colonoscope                            was passed under direct vision. Throughout the                            procedure, the patient's blood pressure, pulse, and                            oxygen saturations were monitored continuously. The                            Colonoscope was introduced through the anus and                            advanced to the  the terminal ileum. The colonoscopy                            was performed without difficulty. The patient                            tolerated the procedure well. The quality of the                            bowel preparation was excellent. The terminal                            ileum, ileocecal valve, appendiceal orifice, and                            rectum were photographed. Scope In: 2:23:59 PM Scope Out: 2:39:25 PM Scope Withdrawal Time: 0 hours 12 minutes 26 seconds  Total Procedure Duration: 0 hours 15 minutes 26 seconds  Findings:                 The digital rectal exam was normal.                           The terminal ileum appeared normal.                           The entire examined colon appeared normal.                           The retroflexed view of the distal rectum and anal                            verge was normal and showed no anal or rectal                            abnormalities. Complications:            No immediate complications. Estimated Blood Loss:     Estimated blood loss: none. Impression:               - The examined portion of  the ileum was normal.                           - The entire examined colon is normal.                           - The distal rectum and anal verge are normal on                            retroflexion view.                           - No specimens collected. Recommendation:           - Patient has a contact number available for                            emergencies. The signs and symptoms of potential                            delayed complications were discussed with the                            patient. Return to normal activities tomorrow.                            Written discharge instructions were provided to the                            patient.                           - Resume previous diet.                           - Continue present medications.                           - Repeat colonoscopy in 10  years for screening                            purposes. Jerene Bears, MD 05/05/2017 2:49:29 PM This report has been signed electronically.

## 2017-05-05 NOTE — Op Note (Signed)
[facility] Patient Name: Jayel Scaduto Procedure Date: 05/05/2017 2:05 PM MRN: [id number] Endoscopist: Jerene Bears , MD Age: 41 Referring MD:  Date of Birth: 10/29/1975 Gender: Female Account #: [id number] Procedure:                Upper GI endoscopy Indications:              Epigastric abdominal pain, Globus sensation Medicines:                Monitored Anesthesia Care Procedure:                Pre-Anesthesia Assessment:                           - Prior to the procedure, a History and Physical                            was performed, and patient medications and                            allergies were reviewed. The patient's tolerance of                            previous anesthesia was also reviewed. The risks                            and benefits of the procedure and the sedation                            options and risks were discussed with the patient.                            All questions were answered, and informed consent                            was obtained. Prior Anticoagulants: The patient has                            taken no previous anticoagulant or antiplatelet                            agents. ASA Grade Assessment: II - A patient with                            mild systemic disease. After reviewing the risks                            and benefits, the patient was deemed in                            satisfactory condition to undergo the procedure.                           After obtaining informed consent, the endoscope was  passed under direct vision. Throughout the                            procedure, the patient's blood pressure, pulse, and                            oxygen saturations were monitored continuously. The                            Endoscope was introduced through the mouth, and                            advanced to the second part of duodenum. The upper                            GI endoscopy  was accomplished without difficulty.                            The patient tolerated the procedure well. Scope In: Scope Out: Findings:                 The examined esophagus was normal.                           The entire examined stomach was normal. Biopsies                            were taken with a cold forceps for histology and                            Helicobacter pylori testing.                           The examined duodenum was normal. Biopsies for                            histology were taken with a cold forceps for                            evaluation of celiac disease. Complications:            No immediate complications. Estimated Blood Loss:     Estimated blood loss was minimal. Impression:               - Normal esophagus.                           - Normal stomach. Biopsied.                           - Normal examined duodenum. Biopsied. Recommendation:           - Patient has a contact number available for                            emergencies. The signs and symptoms of potential  delayed complications were discussed with the                            patient. Return to normal activities tomorrow.                            Written discharge instructions were provided to the                            patient.                           - Resume previous diet.                           - Continue present medications.                           - Await pathology results. Jerene Bears, MD 05/05/2017 2:46:44 PM This report has been signed electronically.

## 2017-05-05 NOTE — Patient Instructions (Signed)
YOU HAD AN ENDOSCOPIC PROCEDURE TODAY AT THE [facility]:   Refer to the procedure report that was given to you for any specific questions about what was found during the examination.  If the procedure report does not answer your questions, please call your gastroenterologist to clarify.  If you requested that your care partner not be given the details of your procedure findings, then the procedure report has been included in a sealed envelope for you to review at your convenience later.  YOU SHOULD EXPECT: Some feelings of bloating in the abdomen. Passage of more gas than usual.  Walking can help get rid of the air that was put into your GI tract during the procedure and reduce the bloating. If you had a lower endoscopy (such as a colonoscopy or flexible sigmoidoscopy) you may notice spotting of blood in your stool or on the toilet paper. If you underwent a bowel prep for your procedure, you may not have a normal bowel movement for a few days.  Please Note:  You might notice some irritation and congestion in your nose or some drainage.  This is from the oxygen used during your procedure.  There is no need for concern and it should clear up in a day or so.  SYMPTOMS TO REPORT IMMEDIATELY:   Following lower endoscopy (colonoscopy or flexible sigmoidoscopy):  Excessive amounts of blood in the stool  Significant tenderness or worsening of abdominal pains  Swelling of the abdomen that is new, acute  Fever of 100F or higher   Following upper endoscopy (EGD)  Vomiting of blood or coffee ground material  New chest pain or pain under the shoulder blades  Painful or persistently difficult swallowing  New shortness of breath  Fever of 100F or higher  Black, tarry-looking stools  For urgent or emergent issues, a gastroenterologist can be reached at any hour by calling [phone].   DIET:  We do recommend a small meal at first, but then you may proceed to your regular diet.  Drink  plenty of fluids but you should avoid alcoholic beverages for 24 hours.  ACTIVITY:  You should plan to take it easy for the rest of today and you should NOT DRIVE or use heavy machinery until tomorrow (because of the sedation medicines used during the test).    FOLLOW UP: Our staff will call the number listed on your records the next business day following your procedure to check on you and address any questions or concerns that you may have regarding the information given to you following your procedure. If we do not reach you, we will leave a message.  However, if you are feeling well and you are not experiencing any problems, there is no need to return our call.  We will assume that you have returned to your regular daily activities without incident.  If any biopsies were taken you will be contacted by phone or by letter within the next 1-3 weeks.  Please call us at [phone] if you have not heard about the biopsies in 3 weeks.    SIGNATURES/CONFIDENTIALITY: You and/or your care partner have signed paperwork which will be entered into your electronic medical record.  These signatures attest to the fact that that the information above on your After Visit Summary has been reviewed and is understood.  Full responsibility of the confidentiality of this discharge information lies with you and/or your care-partner. 

## 2017-05-06 ENCOUNTER — Telehealth: Payer: Self-pay

## 2017-05-06 NOTE — Telephone Encounter (Signed)
  Follow up Call-  Call back number 05/05/2017  Post procedure Call Back phone  # [phone]  Permission to leave phone message Yes  Some recent data might be hidden     No answer and a vm that has not set up

## 2017-05-06 NOTE — Telephone Encounter (Signed)
Unable to leave message voicemail not set up. 

## 2017-05-11 ENCOUNTER — Encounter: Payer: Self-pay | Admitting: Internal Medicine

## 2017-12-31 ENCOUNTER — Other Ambulatory Visit: Payer: Self-pay | Admitting: Obstetrics and Gynecology

## 2017-12-31 DIAGNOSIS — Z1231 Encounter for screening mammogram for malignant neoplasm of breast: Secondary | ICD-10-CM

## 2018-01-12 ENCOUNTER — Ambulatory Visit (HOSPITAL_COMMUNITY)
Admission: RE | Admit: 2018-01-12 | Discharge: 2018-01-12 | Disposition: A | Discharge status: Home / Self Care | Payer: Self-pay | Source: Ambulatory Visit | Attending: Obstetrics and Gynecology | Admitting: Obstetrics and Gynecology

## 2018-01-12 ENCOUNTER — Encounter (HOSPITAL_COMMUNITY): Payer: Self-pay | Admitting: *Deleted

## 2018-01-12 ENCOUNTER — Ambulatory Visit
Admission: RE | Admit: 2018-01-12 | Discharge: 2018-01-12 | Disposition: A | Discharge status: Home / Self Care | Payer: No Typology Code available for payment source | Source: Ambulatory Visit | Attending: Obstetrics and Gynecology | Admitting: Obstetrics and Gynecology

## 2018-01-12 VITALS — BP 102/64 | Wt 148.8 lb

## 2018-01-12 DIAGNOSIS — Z1239 Encounter for other screening for malignant neoplasm of breast: Secondary | ICD-10-CM

## 2018-01-12 DIAGNOSIS — Z1231 Encounter for screening mammogram for malignant neoplasm of breast: Secondary | ICD-10-CM

## 2018-01-12 NOTE — Progress Notes (Signed)
No complaints today.   Pap Smear: Pap smear not completed today. Last Pap smear was 05/08/2014 at [facility] and normal with negative HPV. Per patient has no history of an abnormal Pap smear. Last Pap smear result is in EPIC.  Physical exam: Breasts Breasts symmetrical. No skin abnormalities bilateral breasts. No nipple retraction bilateral breasts. No nipple discharge bilateral breasts. No lymphadenopathy. No lumps palpated left breast. Palpated a lump within the right breast at 11 o'clock 2 cm from the nipple that has been there since 2016 and followed. Per patient she has not noticed any changes. No complaints of pain or tenderness on exam. Referred patient to the [facility] for a screening mammogram per recommendation. Appointment scheduled for Tuesday, January 12, 2018 at 1440.        Pelvic/Bimanual No Pap smear completed today since last Pap smear and HPV typing was 05/08/2014. Pap smear not indicated per BCCCP guidelines.   Smoking History: Patient is a former smoker that quit 4 years ago.  Patient Navigation: Patient education provided. Access to services provided for patient through [facility] program. Spanish interpreter provided.   Breast and Cervical Cancer Risk Assessment: Patient has no family history of her mother having breast cancer. Patient has no known genetic mutations or history of radiation treatment to the chest before age 53. Patient has no history of cervical dysplasia, immunocompromised, or DES exposure in-utero.  Risk Assessment    Risk Scores      01/12/2018   Last edited by: Armond Hang, LPN   5-year risk: 1.3 %   Lifetime risk: 16.6 %         Used Spanish interpreter Rudene Anda from [place].

## 2018-01-12 NOTE — Patient Instructions (Signed)
Explained breast self awareness with Yvette Johnson. Patient did not need a Pap smear today due to last Pap smear and HPV typing was 05/08/2014. Let her know BCCCP will cover Pap smears and HPV typing every 5 years unless has a history of abnormal Pap smears. Referred patient to the [facility] for a screening mammogram per recommendation. Appointment scheduled for Tuesday, January 12, 2018 at 1440. Let patient know the [facility] will follow up with her within the next couple weeks with results of mammogram by letter or phone. Yvette Johnson verbalized understanding.  Samaira Holzworth, Arvil Ch[place], RN 2:12 PM

## 2018-01-13 ENCOUNTER — Encounter (HOSPITAL_COMMUNITY): Payer: Self-pay | Admitting: *Deleted

## 2018-01-20 ENCOUNTER — Other Ambulatory Visit: Payer: No Typology Code available for payment source

## 2018-01-20 ENCOUNTER — Ambulatory Visit: Payer: No Typology Code available for payment source

## 2019-01-17 ENCOUNTER — Other Ambulatory Visit: Payer: Self-pay

## 2019-01-18 ENCOUNTER — Encounter: Payer: Self-pay | Admitting: Obstetrics & Gynecology

## 2019-01-18 ENCOUNTER — Ambulatory Visit (INDEPENDENT_AMBULATORY_CARE_PROVIDER_SITE_OTHER): Payer: Self-pay | Admitting: Obstetrics & Gynecology

## 2019-01-18 VITALS — BP 114/78 | Ht 64.5 in | Wt 152.0 lb

## 2019-01-18 DIAGNOSIS — R102 Pelvic and perineal pain: Secondary | ICD-10-CM

## 2019-01-18 DIAGNOSIS — N911 Secondary amenorrhea: Secondary | ICD-10-CM

## 2019-01-18 DIAGNOSIS — N951 Menopausal and female climacteric states: Secondary | ICD-10-CM

## 2019-01-18 NOTE — Patient Instructions (Signed)
1. Secondary amenorrhea Secondary amenorrhea for almost a year at age 43 now.  Possible anovulation, perimenopause or menopause.  Recent TSH normal.  Will complete the work-up with a prolactin and an [facility] today.  Patient will follow-up with a pelvic ultrasound to evaluate the ovaries and the endometrial lining.  Will decide on management per findings.  Family history of breast cancer at age 79 in mother.  Patient will verify with her mother as to whether BRCA 1 and 2 were done.  No other breast cancer in family and no history of ovarian cancer or colon cancer.  Consider genetic consultation. - FSH - Prolactin - US Transvaginal Non-OB; Future  2. Perimenopause Possible perimenopausal menopause.  Work-up and management as described above. - FSH - Prolactin - US Transvaginal Non-OB; Future  3. Pelvic pain in female Intermittent crampy pelvic pain.  Possible intestinal origin with history of IBS.  Rule out gynecologic pathology. - US Transvaginal Non-OB; Future  Other orders - Multiple Vitamin (MULTIVITAMIN) tablet; Take 1 tablet by mouth daily. - levothyroxine (SYNTHROID) 50 MCG tablet; Take 50 mcg by mouth daily before breakfast.  Luanna Salk, fue un placer encontrarle hoy!  Voy a informarle de sus [employer].

## 2019-01-18 NOTE — Progress Notes (Signed)
    Yvette Johnson 06/23/1976 [id number]        43 y.o.  G1P1L1 Married.  Daughter is 10 yo (C/S)  RP: Amenorrhea since 02/2018  HPI: Secondary infertility.  No menses x 02/2018.  Had Oligomenorrhea at that time with a very light menstrual period in 02/2018.  No vaginal bleeding at all since then.  Intermittent pelvic cramps, feels that they happen monthly with tender breasts.  C/O frequent hot flushes, night sweats and vaginal dryness with IC.  Patient Dxed with IBS.  Hypothyroidism under treatment.  Mother with Breast Ca at age 59.  No genetic testing done in mother or patient.  No other family members with Breast Ca.  No H/O Ovarian or Colon Ca in family.   OB History  Gravida Para Term Preterm AB Living  1 1       1  SAB TAB Ectopic Multiple Live Births          1    # Outcome Date GA Lbr Len/2nd Weight Sex Delivery Anes PTL Lv  1 Para 08/02/08            Past medical history,surgical history, problem list, medications, allergies, family history and social history were all reviewed and documented in the EPIC chart.   Directed ROS with pertinent positives and negatives documented in the history of present illness/assessment and plan.  Exam:  Vitals:   01/18/19 0844  BP: 114/78  Weight: 152 lb (68.9 kg)  Height: 5' 4.5" (1.638 m)   General appearance:  Normal  Abdomen: Normal  Gynecologic exam: Vulva normal.  Speculum:  Cervix normal, vagina normal.  Normal vaginal secretions.  Bimanual exam:  Uterus AV, normal volume, mobile, NT.  No adnexal mass felt, NT bilaterally.  Gono-Chlam negative 12/29/2018  TSH 12/29/2018 normal at 1.690   Assessment/Plan:  43 y.o. G1P1   1. Secondary amenorrhea Secondary amenorrhea for almost a year at age 43 now.  Possible anovulation, perimenopause or menopause.  Recent TSH normal.  Will complete the work-up with a prolactin and an FSH today.  Patient will follow-up with a pelvic ultrasound to evaluate the ovaries and the endometrial lining.   Will decide on management per findings.  Family history of breast cancer at age 59 in mother.  Patient will verify with her mother as to whether BRCA 1 and 2 were done.  No other breast cancer in family and no history of ovarian cancer or colon cancer.  Consider genetic consultation. - FSH - Prolactin - US Transvaginal Non-OB; Future  2. Perimenopause Possible perimenopausal menopause.  Work-up and management as described above. - FSH - Prolactin - US Transvaginal Non-OB; Future  3. Pelvic pain in female Intermittent crampy pelvic pain.  Possible intestinal origin with history of IBS.  Rule out gynecologic pathology. - US Transvaginal Non-OB; Future  Other orders - Multiple Vitamin (MULTIVITAMIN) tablet; Take 1 tablet by mouth daily. - levothyroxine (SYNTHROID) 50 MCG tablet; Take 50 mcg by mouth daily before breakfast.  Counseling on above issues and coordination of care more than 50% for 45 minutes.  Marie-Lyne Lavoie MD, 9:17 AM 01/18/2019   

## 2019-01-19 LAB — PROLACTIN: Prolactin: 8.4 ng/mL

## 2019-01-19 LAB — FOLLICLE STIMULATING HORMONE: FSH: 96.1 m[IU]/mL

## 2019-02-09 ENCOUNTER — Other Ambulatory Visit: Payer: Self-pay

## 2019-02-10 ENCOUNTER — Encounter: Payer: Self-pay | Admitting: Obstetrics & Gynecology

## 2019-02-10 ENCOUNTER — Ambulatory Visit (INDEPENDENT_AMBULATORY_CARE_PROVIDER_SITE_OTHER): Payer: Self-pay

## 2019-02-10 ENCOUNTER — Ambulatory Visit (INDEPENDENT_AMBULATORY_CARE_PROVIDER_SITE_OTHER): Payer: Self-pay | Admitting: Obstetrics & Gynecology

## 2019-02-10 VITALS — BP 116/70

## 2019-02-10 DIAGNOSIS — R102 Pelvic and perineal pain: Secondary | ICD-10-CM

## 2019-02-10 DIAGNOSIS — N911 Secondary amenorrhea: Secondary | ICD-10-CM

## 2019-02-10 DIAGNOSIS — Z780 Asymptomatic menopausal state: Secondary | ICD-10-CM

## 2019-02-10 DIAGNOSIS — N951 Menopausal and female climacteric states: Secondary | ICD-10-CM

## 2019-02-10 MED ORDER — ESTRADIOL 0.1 MG/24HR TD PTTW: 1.0000 | MEDICATED_PATCH | TRANSDERMAL | 4 refills | Status: AC

## 2019-02-10 MED ORDER — PROGESTERONE MICRONIZED 100 MG PO CAPS: 100.0000 mg | ORAL_CAPSULE | Freq: Every day | ORAL | 4 refills | Status: AC

## 2019-02-10 MED FILL — ESTRADIOL 0.1 MG PATCH: 0.1 | 28 days supply | Qty: 8 | Fill #0

## 2019-02-10 NOTE — Patient Instructions (Signed)
1. Secondary amenorrhea Pelvic US findings reviewed thoroughly with patient.  Pelvic US normal, concordant with the Dx of menopause given small atrophic ovaries bilaterally and a thin endometrial line at 2.8 mm.    2. Postmenopause Menopause confirmed at age 43.  Counseling on HRT with thorough discussion of benefits and risks.  Given her relatively young age and absence of risk factors for HRT, recommend to start on Estradiol patch 0.1 twice a week and Micronized Progesterone 100 mg daily at bedtime.  Precautions reviewed.  Usage reviewed and prescription sent to pharmacy.  Other orders - estradiol (VIVELLE-DOT) 0.1 MG/24HR patch; Place 1 patch (0.1 mg total) onto the skin 2 (two) times a week. - progesterone (PROMETRIUM) 100 MG capsule; Take 1 capsule (100 mg total) by mouth daily.  Yvette Johnson, fue un placer verle hoy!

## 2019-02-10 NOTE — Progress Notes (Signed)
    Yvette Johnson 23-Sep-1975 [id number]        43 y.o.  G1P1L1 Married  RP: Secondary amenorrhea for Pelvic US  HPI: Secondary amenorrhea x 02/2018. Secondary infertility.  Hot flushes/night sweats present.  [place] on 01/18/2019 high at 96.1.   OB History  Gravida Para Term Preterm AB Living  1 1       1   SAB TAB Ectopic Multiple Live Births          1    # Outcome Date GA Lbr Len/2nd Weight Sex Delivery Anes PTL Lv  1 Para 08/02/08            Past medical history,surgical history, problem list, medications, allergies, family history and social history were all reviewed and documented in the EPIC chart.   Directed ROS with pertinent positives and negatives documented in the history of present illness/assessment and plan.  Exam:  Vitals:   02/10/19 0831  BP: 116/70   General appearance:  Normal  Pelvic US today: T/V images.  Anteverted uterus measuring 7.1 x 3.15 x 2.66 cm.  Normal size and shape.  No myometrial mass.  Thin symmetrical endometrial lining at 2.8 mm with no mass seen.  Both ovaries are small with atrophic appearance.  No adnexal mass.  No free fluid in the posterior cul-de-sac.  Nontender vaginal exam.  FSH 96.1 on 01/18/2019   Assessment/Plan:  43 y.o. G1P1   1. Secondary amenorrhea Pelvic US findings reviewed thoroughly with patient.  Pelvic US normal, concordant with the Dx of menopause given small atrophic ovaries bilaterally and a thin endometrial line at 2.8 mm.    2. Postmenopause Menopause confirmed at age 33.  Counseling on HRT with thorough discussion of benefits and risks.  Given her relatively young age and absence of risk factors for HRT, recommend to start on Estradiol patch 0.1 twice a week and Micronized Progesterone 100 mg daily at bedtime.  Precautions reviewed.  Usage reviewed and prescription sent to pharmacy.  Other orders - estradiol (VIVELLE-DOT) 0.1 MG/24HR patch; Place 1 patch (0.1 mg total) onto the skin 2 (two) times a week. -  progesterone (PROMETRIUM) 100 MG capsule; Take 1 capsule (100 mg total) by mouth daily.  Counseling on above issues and coordination of care >50% x 25 minutes.  Princess Bruins MD, 8:56 AM 02/10/2019

## 2019-03-15 ENCOUNTER — Other Ambulatory Visit (HOSPITAL_COMMUNITY): Payer: Self-pay | Admitting: *Deleted

## 2019-03-15 DIAGNOSIS — Z1231 Encounter for screening mammogram for malignant neoplasm of breast: Secondary | ICD-10-CM

## 2019-06-02 ENCOUNTER — Ambulatory Visit
Admission: RE | Admit: 2019-06-02 | Discharge: 2019-06-02 | Disposition: A | Discharge status: Home / Self Care | Payer: No Typology Code available for payment source | Source: Ambulatory Visit | Attending: Obstetrics and Gynecology | Admitting: Obstetrics and Gynecology

## 2019-06-02 ENCOUNTER — Other Ambulatory Visit: Payer: Self-pay

## 2019-06-02 ENCOUNTER — Encounter (HOSPITAL_COMMUNITY): Payer: Self-pay

## 2019-06-02 ENCOUNTER — Ambulatory Visit (HOSPITAL_COMMUNITY)
Admission: RE | Admit: 2019-06-02 | Discharge: 2019-06-02 | Disposition: A | Discharge status: Home / Self Care | Payer: No Typology Code available for payment source | Source: Ambulatory Visit | Attending: Obstetrics and Gynecology | Admitting: Obstetrics and Gynecology

## 2019-06-02 DIAGNOSIS — Z1231 Encounter for screening mammogram for malignant neoplasm of breast: Secondary | ICD-10-CM

## 2019-06-02 DIAGNOSIS — Z1239 Encounter for other screening for malignant neoplasm of breast: Secondary | ICD-10-CM | POA: Insufficient documentation

## 2019-06-02 NOTE — Patient Instructions (Signed)
Explained breast self awareness with Tsosie Billing. Patient did not need a Pap smear today due to last Pap smear was in October 2019 per patient. Let her know BCCCP will cover Pap smears every 3 years unless has a history of abnormal Pap smears. Referred patient to the [facility] for a screening mammogram per recommendation. Appointment scheduled for Thursday, June 02, 2019 at 1310. Patient aware of appointment and will be there. Let patient know the [facility] will follow up with her within the next couple weeks with results of mammogram by letter or phone. Tsosie Billing verbalized understanding.  Ricarda Atayde, Arvil Chaco, RN 11:51 AM

## 2019-06-02 NOTE — Progress Notes (Signed)
No complaints today.   Pap Smear: Pap smear not completed today. Last Pap smear was in October 2019 at [facility] and normal per patient. Per patient has no history of an abnormal Pap smear. Last Pap smear result is not in Epic. Previous Pap smear result from 05/08/2014 is in [place].  Physical exam: Breasts Breasts symmetrical. No skin abnormalities bilateral breasts. No nipple retraction bilateral breasts. No nipple discharge bilateral breasts. No lymphadenopathy. No lumps palpated left breast. Palpated a lump within the right breast at 11 o'clock 2 cm from the nipple that has been there since 2016 and followed. Per patient she has not noticed any changes. No complaints of pain or tenderness on exam. Referred patient to the [facility] [facility] for a screening mammogram per recommendation. Appointment scheduled for Thursday, June 02, 2019 at 1310.         Pelvic/Bimanual No Pap smear completed today since last Pap smear was in October 2019 per patient. Pap smear not indicated per BCCCP guidelines.   Smoking History: Patient is a former smoker that quit 5 years ago.  Patient Navigation: Patient education provided. Access to services provided for patient through [facility] program. Spanish interpreter provided.   Breast and Cervical Cancer Risk Assessment: Patient has a family history of her mother having breast cancer. Patient has no known genetic mutations or history of radiation treatment to the chest before age 68. Patient has no history of cervical dysplasia, immunocompromised, or DES exposure in-utero.  Risk Assessment    Risk Scores      06/02/2019 01/12/2018   Last edited by: Loletta Parish, RN Armond Hang, LPN   5-year risk: 1.4 % 1.3 %   Lifetime risk: 16.5 % 16.6 %         Used Spanish interpreter Rudene Anda from [place].

## 2019-08-02 ENCOUNTER — Ambulatory Visit
Admission: RE | Admit: 2019-08-02 | Discharge: 2019-08-02 | Disposition: A | Discharge status: Home / Self Care | Payer: No Typology Code available for payment source | Source: Ambulatory Visit | Attending: Obstetrics and Gynecology | Admitting: Obstetrics and Gynecology

## 2019-08-02 ENCOUNTER — Other Ambulatory Visit: Payer: Self-pay

## 2020-07-12 ENCOUNTER — Other Ambulatory Visit: Payer: Self-pay | Admitting: Obstetrics and Gynecology

## 2020-07-12 DIAGNOSIS — Z1231 Encounter for screening mammogram for malignant neoplasm of breast: Secondary | ICD-10-CM

## 2020-07-24 ENCOUNTER — Ambulatory Visit: Payer: Self-pay

## 2020-08-23 ENCOUNTER — Other Ambulatory Visit: Payer: Self-pay

## 2020-08-23 ENCOUNTER — Ambulatory Visit
Admission: RE | Admit: 2020-08-23 | Discharge: 2020-08-23 | Disposition: A | Discharge status: Home / Self Care | Payer: No Typology Code available for payment source | Source: Ambulatory Visit | Attending: Obstetrics and Gynecology | Admitting: Obstetrics and Gynecology

## 2020-08-23 ENCOUNTER — Ambulatory Visit: Payer: No Typology Code available for payment source | Admitting: *Deleted

## 2020-08-23 VITALS — BP 118/82 | Wt 156.7 lb

## 2020-08-23 DIAGNOSIS — Z1231 Encounter for screening mammogram for malignant neoplasm of breast: Secondary | ICD-10-CM

## 2020-08-23 DIAGNOSIS — Z01419 Encounter for gynecological examination (general) (routine) without abnormal findings: Secondary | ICD-10-CM

## 2020-08-23 NOTE — Progress Notes (Signed)
Yvette Johnson is a 45 y.o. G1P1 female who presents to [facility] clinic today with complaint of pain at times at site that per patient had a right breast lumpectomy in 2007 for benign cysts. Patient states that she has had pain at times since the surgery. Patient denied pain today. Last screening mammogram was completed 08/02/2019 and negative.   Pap Smear: Pap smear completed today. Last Pap smear was 05/08/2014 at [facility] and was normal with negative HPV. Per patient has no history of an abnormal Pap smear. Last Pap smear result is available in Epic.   Physical exam: Breasts Breasts symmetrical. No skin abnormalities left breast. Observed a scar right breast at 11 o'clock next to areola where patient had lumpectomy in 2007. No nipple retraction bilateral breasts. No nipple discharge bilateral breasts. No lymphadenopathy. No lumps palpated bilateral breasts. No complaints of pain or tenderness on exam.      Pelvic/Bimanual Ext Genitalia No lesions, no swelling and no discharge observed on external genitalia.        Vagina Vagina pink and normal texture. No lesions or discharge observed in vagina.        Cervix Cervix is present. Cervix pink and of normal texture. No discharge observed.    Uterus Uterus is present and palpable. Uterus in normal position and normal size.        Adnexae Bilateral ovaries present and palpable. No tenderness on palpation.         Rectovaginal No rectal exam completed today since patient had no rectal complaints. No skin abnormalities observed on exam.     Smoking History: Patient is a former smoker that quit in 2016.   Patient Navigation: Patient education provided. Access to services provided for patient through [place] program. Spanish interpreter Rudene Anda from [facility] provided.    Breast and Cervical Cancer Risk Assessment: Patient has a family history of her mother having breast cancer. Patient has no known  genetic mutations or history of radiation treatment to the chest before age 33. Patient has no history of cervical dysplasia, immunocompromised, or DES exposure in-utero.  Risk Assessment    Risk Scores      08/23/2020 06/02/2019   Last edited by: Royston Bake, CMA Marinda Tyer, Heath Gold, RN   5-year risk: 1.6 % 1.4 %   Lifetime risk: 16.3 % 16.5 %          A: BCCCP exam with pap smear Complaint of right breast pain at lumpectomy site at times.  P: Referred patient to the [facility] for a screening mammogram on mobile unit. Appointment scheduled Thursday, August 23, 2020 at 1430.  Loletta Parish, RN 08/23/2020 1:34 PM

## 2020-08-23 NOTE — Patient Instructions (Signed)
Explained breast self awareness with Yvette Johnson. Pap smear completed today. Let her know BCCCP will cover Pap smears and HPV typing every 5 years unless has a history of abnormal Pap smears. Referred patient to the [facility] for a screening mammogram on mobile unit. Appointment scheduled Thursday, August 23, 2020 at 1430. Patient escorted to the mobile unit following BCCCP appointment for her screening mammogram. Let patient know will follow up with her within the next couple weeks with results of her Pap smear by letter or phone. Informed patient that the Breast Center will follow-up with her within the next couple of weeks with results of her mammogram by letter or phone. Yvette Johnson verbalized understanding.  Yvette Johnson, Arvil Chaco, RN 1:34 PM

## 2020-08-24 LAB — CYTOLOGY - PAP
Comment: NEGATIVE
Diagnosis: NEGATIVE
High risk HPV: NEGATIVE

## 2020-08-27 ENCOUNTER — Telehealth: Payer: Self-pay

## 2020-08-27 NOTE — Telephone Encounter (Signed)
Spoke with patient via interpreter Rudene Anda, [employer] to give pap results. Pap normal and HPV negative. Next pap smear will be due in 5 years. Patient voiced understanding.

## 2020-10-01 ENCOUNTER — Other Ambulatory Visit: Payer: Self-pay

## 2020-10-01 ENCOUNTER — Ambulatory Visit: Payer: Self-pay

## 2021-10-15 ENCOUNTER — Other Ambulatory Visit: Payer: Self-pay

## 2021-10-15 DIAGNOSIS — Z1231 Encounter for screening mammogram for malignant neoplasm of breast: Secondary | ICD-10-CM

## 2022-01-02 ENCOUNTER — Ambulatory Visit
Admission: RE | Admit: 2022-01-02 | Discharge: 2022-01-02 | Disposition: A | Discharge status: Home / Self Care | Payer: No Typology Code available for payment source | Source: Ambulatory Visit | Attending: Obstetrics and Gynecology | Admitting: Obstetrics and Gynecology

## 2022-01-02 ENCOUNTER — Ambulatory Visit: Payer: Self-pay | Admitting: *Deleted

## 2022-01-02 VITALS — BP 120/78 | Wt 160.7 lb

## 2022-01-02 DIAGNOSIS — Z1211 Encounter for screening for malignant neoplasm of colon: Secondary | ICD-10-CM

## 2022-01-02 DIAGNOSIS — Z1239 Encounter for other screening for malignant neoplasm of breast: Secondary | ICD-10-CM

## 2022-01-02 DIAGNOSIS — Z1231 Encounter for screening mammogram for malignant neoplasm of breast: Secondary | ICD-10-CM

## 2022-01-02 NOTE — Patient Instructions (Signed)
Explained breast self awareness with Tsosie Billing. Patient did not need a Pap smear today due to last Pap smear and HPV typing was 08/23/2020. Let her know BCCCP will cover Pap smears and HPV typing every 5 years unless has a history of abnormal Pap smears. Referred patient to the [facility] for a screening mammogram on mobile unit. Appointment scheduled Thursday, January 02, 2022 at 1020. Patient aware of appointment and will be there. Let patient know the [facility] Center will follow up with her within the next couple weeks with results of her mammogram by letter or phone. Tsosie Billing verbalized understanding.  Ranita Stjulien, Arvil Chaco, RN 9:25 AM

## 2022-01-02 NOTE — Progress Notes (Signed)
Ms. Joyceline Maiorino is a 46 y.o. female who presents to [facility] clinic today with no complaints.    Pap Smear: Pap not smear completed today. Last Pap smear was 08/23/2020 at [facility] clinic and was normal with negative HPV. Per patient has no history of an abnormal Pap smear. Last Pap smear result is available in Epic.   Physical exam: Breasts Breasts symmetrical. No skin abnormalities left breast. Observed a scar right breast at 11 o'clock next to areola where patient had lumpectomy in 2007. No nipple retraction bilateral breasts. No nipple discharge bilateral breasts. No lymphadenopathy. No lumps palpated bilateral breasts. No complaints of pain or tenderness on exam.     MS DIGITAL SCREENING TOMO BILATERAL  Result Date: 08/28/2020 CLINICAL DATA:  Screening. EXAM: DIGITAL SCREENING BILATERAL MAMMOGRAM WITH TOMOSYNTHESIS AND CAD TECHNIQUE: Bilateral screening digital craniocaudal and mediolateral oblique mammograms were obtained. Bilateral screening digital breast tomosynthesis was performed. The images were evaluated with computer-aided detection. COMPARISON:  Previous exam(s). ACR Breast Density Category c: The breast tissue is heterogeneously dense, which may obscure small masses. FINDINGS: There are no findings suspicious for malignancy. IMPRESSION: No mammographic evidence of malignancy. A result letter of this screening mammogram will be mailed directly to the patient. RECOMMENDATION: Screening mammogram in one year. (Code:SM-B-01Y) BI-RADS CATEGORY  1: Negative. Electronically Signed   By: Franki Cabot M.D.   On: 08/28/2020 11:13   MS DIGITAL SCREENING TOMO BILATERAL  Result Date: 08/03/2019 CLINICAL DATA:  Screening. EXAM: DIGITAL SCREENING BILATERAL MAMMOGRAM WITH TOMO AND CAD COMPARISON:  Previous exam(s). ACR Breast Density Category d: The breast tissue is extremely dense, which lowers the sensitivity of mammography FINDINGS: There are no findings suspicious for malignancy. Images were processed  with CAD. IMPRESSION: No mammographic evidence of malignancy. A result letter of this screening mammogram will be mailed directly to the patient. RECOMMENDATION: Screening mammogram in one year. (Code:SM-B-01Y) BI-RADS CATEGORY  1: Negative. Electronically Signed   By: Margarette Canada M.D.   On: 08/03/2019 10:01   MS DIGITAL SCREENING TOMO BILATERAL  Result Date: 01/12/2018 CLINICAL DATA:  Screening. EXAM: DIGITAL SCREENING BILATERAL MAMMOGRAM WITH TOMO AND CAD COMPARISON:  Previous exam(s). ACR Breast Density Category d: The breast tissue is extremely dense, which lowers the sensitivity of mammography. FINDINGS: There are no findings suspicious for malignancy. Images were processed with CAD. IMPRESSION: No mammographic evidence of malignancy. A result letter of this screening mammogram will be mailed directly to the patient. RECOMMENDATION: Screening mammogram in one year. (Code:SM-B-01Y) BI-RADS CATEGORY  1: Negative. Electronically Signed   By: Ammie Ferrier M.D.   On: 01/12/2018 14:38    Pelvic/Bimanual Pap is not indicated today per BCCCP guidelines.   Smoking History: Patient is a former smoker that quit in 2016.   Patient Navigation: Patient education provided. Access to services provided for patient through [place] program. Spanish interpreter Edwinna Areola from [facility] provided.    Colorectal Cancer Screening: Per patient has had colonoscopy completed on 05/05/2017.  FIT Test given to patient to complete. No complaints today.    Breast and Cervical Cancer Risk Assessment: Patient has a family history of her mother having breast cancer. Patient has no known genetic mutations or history of radiation treatment to the chest before age 64. Patient has no history of cervical dysplasia, immunocompromised, or DES exposure in-utero.  Risk Assessment     Risk Scores       01/02/2022 08/23/2020   Last edited by: Royston Bake, [place], [place], [place]  5-year risk: 1.8 % 1.6 %   Lifetime  risk: 16 % 16.3 %            A: BCCCP exam without pap smear No complaints.  P: Referred patient to the [employer] for a screening mammogram on mobile unit. Appointment scheduled Thursday, January 02, 2022 at 1020.  Loletta Parish, RN 01/02/2022 9:24 AM

## 2022-01-10 LAB — FECAL OCCULT BLOOD, IMMUNOCHEMICAL: Fecal Occult Bld: NEGATIVE

## 2022-01-13 ENCOUNTER — Telehealth: Payer: Self-pay

## 2022-01-13 NOTE — Telephone Encounter (Signed)
[place], [employer] [phone]), attempted to contact patient regarding lab results (FIT Test), not able to leave a message, voicemail not set up.

## 2022-01-15 ENCOUNTER — Telehealth: Payer: Self-pay

## 2022-01-15 NOTE — Telephone Encounter (Signed)
[place], Spanish Interpreter @ [employer] [phone]), Patient informed negative FIT test results. Patient verbalized understanding.

## 2022-03-14 ENCOUNTER — Other Ambulatory Visit: Payer: Self-pay

## 2022-03-14 DIAGNOSIS — I8390 Asymptomatic varicose veins of unspecified lower extremity: Secondary | ICD-10-CM

## 2022-03-18 ENCOUNTER — Ambulatory Visit (HOSPITAL_COMMUNITY): Payer: Self-pay

## 2022-03-19 ENCOUNTER — Encounter: Payer: No Typology Code available for payment source | Admitting: Vascular Surgery

## 2022-05-26 ENCOUNTER — Other Ambulatory Visit: Payer: Self-pay | Admitting: *Deleted

## 2022-05-26 DIAGNOSIS — I8390 Asymptomatic varicose veins of unspecified lower extremity: Secondary | ICD-10-CM

## 2022-06-02 ENCOUNTER — Encounter (HOSPITAL_COMMUNITY): Payer: No Typology Code available for payment source

## 2022-07-10 ENCOUNTER — Other Ambulatory Visit (HOSPITAL_BASED_OUTPATIENT_CLINIC_OR_DEPARTMENT_OTHER): Payer: Self-pay

## 2022-07-10 MED ORDER — AMOXICILLIN 500 MG PO CAPS
500.0000 mg | ORAL_CAPSULE | Freq: Three times a day (TID) | ORAL | 0 refills | Status: DC
  Filled 2022-07-10: qty 21, 7d supply, fill #0

## 2022-08-02 ENCOUNTER — Ambulatory Visit
Admission: EM | Admit: 2022-08-02 | Discharge: 2022-08-02 | Disposition: A | Discharge status: Home / Self Care | Payer: Self-pay | Attending: Urgent Care | Admitting: Urgent Care

## 2022-08-02 DIAGNOSIS — R110 Nausea: Secondary | ICD-10-CM

## 2022-08-02 DIAGNOSIS — R251 Tremor, unspecified: Secondary | ICD-10-CM

## 2022-08-02 DIAGNOSIS — R000 Tachycardia, unspecified: Secondary | ICD-10-CM

## 2022-08-02 DIAGNOSIS — E0500 Thyrotoxicosis with diffuse goiter without thyrotoxic crisis or storm: Secondary | ICD-10-CM

## 2022-08-02 LAB — POCT URINALYSIS DIP (MANUAL ENTRY)
Bilirubin, UA: NEGATIVE
Glucose, UA: NEGATIVE mg/dL
Ketones, POC UA: NEGATIVE mg/dL
Leukocytes, UA: NEGATIVE
Nitrite, UA: NEGATIVE
Protein Ur, POC: NEGATIVE mg/dL
Spec Grav, UA: 1.025 (ref 1.010–1.025)
Urobilinogen, UA: 0.2 E.U./dL
pH, UA: 7 (ref 5.0–8.0)

## 2022-08-02 LAB — POCT FASTING CBG KUC MANUAL ENTRY: POCT Glucose (KUC): 140 mg/dL — AB (ref 70–99)

## 2022-08-02 MED ORDER — ONDANSETRON HCL 4 MG/2ML IJ SOLN
4.0000 mg | Freq: Once | INTRAMUSCULAR | Status: AC
  Administered 2022-08-02: 4 mg via INTRAMUSCULAR

## 2022-08-02 MED ORDER — ONDANSETRON 8 MG PO TBDP: 8.0000 mg | ORAL_TABLET | Freq: Three times a day (TID) | ORAL | 0 refills | Status: AC | PRN

## 2022-08-02 NOTE — ED Triage Notes (Signed)
Through video interpreter-pt c/o dizzy, "vision fading", feeling hot and cold, nausea, heart racing and 1 episode of diarrhea-sx started ~115am-pt is pale-ambulatory/was escorted to room by family member holding her arm

## 2022-08-02 NOTE — ED Provider Notes (Signed)
[facility]  Note:  This document was prepared using Systems analyst and may include unintentional dictation errors.  MRN: [id number] DOB: [birth year]  Subjective:   Yvette Johnson is a 47 y.o. female with pmh of Graves disease presenting for nausea without vomiting, elevated blood pressure, wooziness today. Was at a restaurant, felt sudden onset of her symptoms. Had associated headache/pressure, felt hot sensation, palpitations, abdominal bloating, nervousness, shaking.  She did end up having a bowel movement at the restaurant and initially was fully formed but then turned to diarrhea.  Takes levothyroxine for her symptoms.  In the clinic, she reports that her primary symptom is nausea.  She also feels shaky and anxious.  Denies any active headache, confusion, chest pain, shortness of breath, abdominal pain.  Of note, she just finished a course of amoxicillin 07/28/2022, for a dental infection.   No current facility-administered medications for this encounter.  Current Outpatient Medications:    amoxicillin (AMOXIL) 500 MG capsule, Take 1 capsule (500 mg total) by mouth 3 (three) times daily until finished., Disp: 21 capsule, Rfl: 0   estradiol (VIVELLE-DOT) 0.1 MG/24HR patch, Place 1 patch (0.1 mg total) onto the skin 2 (two) times a week. (Patient not taking: Reported on 08/23/2020), Disp: 24 patch, Rfl: 4   levothyroxine (SYNTHROID) 50 MCG tablet, Take 50 mcg by mouth daily before breakfast., Disp: , Rfl:    Multiple Vitamin (MULTIVITAMIN) tablet, Take 1 tablet by mouth daily., Disp: , Rfl:    progesterone (PROMETRIUM) 100 MG capsule, Take 1 capsule (100 mg total) by mouth daily. (Patient not taking: Reported on 08/23/2020), Disp: 90 capsule, Rfl: 4    Allergies  Allergen Reactions   Antihistamines, Diphenhydramine-Type Shortness Of Breath   Penicillins Nausea And Vomiting   Vicks Dayquil Cold & Flu [Dm-Phenylephrine-Acetaminophen] Shortness Of  Breath    Past Medical History:  Diagnosis Date   Asthma    as a child   Constipation    Gastritis    Thyroid disease    Uterine fibroid 2012     Past Surgical History:  Procedure Laterality Date   BREAST CYST ASPIRATION Right 2008    BREAST EXCISIONAL BIOPSY Right    CESAREAN SECTION  2010    VEIN SURGERY      Family History  Problem Relation Age of Onset   Breast cancer Mother 49   Asthma Father    Heart disease Father 74   Cancer Father        testicular   Kidney disease Paternal Uncle    Diabetes Paternal Grandfather    Cancer Paternal Grandfather        lung    Social History   Tobacco Use   Smoking status: Former    Packs/day: 0.25    Years: 23.00    Total pack years: 5.75    Types: Cigarettes    Quit date: 01/18/2015    Years since quitting: 7.5   Smokeless tobacco: Never  Vaping Use   Vaping Use: Never used  Substance Use Topics   Alcohol use: No    Alcohol/week: 0.0 standard drinks of alcohol   Drug use: No    ROS   Objective:   Vitals: BP 128/85 (BP Location: Left Arm)   Pulse (!) 112   Temp 98.6 F (37 C) (Oral)   Resp 16   LMP 09/15/2016 (Approximate)   SpO2 99%   Pulse recheck was 96-98bpm on recheck.   Physical Exam Constitutional:  General: She is not in acute distress.    Appearance: Normal appearance. She is well-developed. She is not ill-appearing, toxic-appearing or diaphoretic.  HENT:     Head: Normocephalic and atraumatic.     Nose: Nose normal.     Mouth/Throat:     Mouth: Mucous membranes are moist.  Eyes:     General: No scleral icterus.       Right eye: No discharge.        Left eye: No discharge.     Extraocular Movements: Extraocular movements intact.     Conjunctiva/sclera: Conjunctivae normal.  Cardiovascular:     Rate and Rhythm: Normal rate and regular rhythm.     Heart sounds: Normal heart sounds. No murmur heard.    No friction rub. No gallop.  Pulmonary:     Effort: Pulmonary effort is normal.  No respiratory distress.     Breath sounds: No stridor. No wheezing, rhonchi or rales.  Chest:     Chest wall: No tenderness.  Abdominal:     General: Bowel sounds are normal. There is no distension.     Palpations: Abdomen is soft. There is no mass.     Tenderness: There is no abdominal tenderness. There is no right CVA tenderness, left CVA tenderness, guarding or rebound.  Skin:    General: Skin is warm and dry.  Neurological:     General: No focal deficit present.     Mental Status: She is alert and oriented to person, place, and time.  Psychiatric:        Mood and Affect: Mood is anxious.        Speech: Speech is rapid and pressured.        Behavior: Behavior normal.        Thought Content: Thought content normal.        Judgment: Judgment normal.     Results for orders placed or performed during the hospital encounter of 08/02/22 (from the past 24 hour(s))  POCT CBG (manual entry)     Status: Abnormal   Collection Time: 08/02/22  1:16 PM  Result Value Ref Range   POCT Glucose ([facility]) 140 (A) 70 - 99 mg/dL  POCT urinalysis dipstick     Status: Abnormal   Collection Time: 08/02/22  1:20 PM  Result Value Ref Range   Color, UA yellow yellow   Clarity, UA clear clear   Glucose, UA negative negative mg/dL   Bilirubin, UA negative negative   Ketones, POC UA negative negative mg/dL   Spec Grav, UA 1.025 1.010 - 1.025   Blood, UA trace-lysed (A) negative   pH, UA 7.0 5.0 - 8.0   Protein Ur, POC negative negative mg/dL   Urobilinogen, UA 0.2 0.2 or 1.0 E.U./dL   Nitrite, UA Negative Negative   Leukocytes, UA Negative Negative   ED ECG REPORT   Date: 08/02/2022  EKG Time: 3:07 PM  Rate: 105bpm  Rhythm: sinus tachycardia,  there are no previous tracings available for comparison  Axis: normal  Intervals:none  ST&T Change: none  Narrative Interpretation: Sinus tachycardia at 105bpm. I disagree with ST abnormality on ECG report. No acute findings, no priors.   IM Zofran given  in clinic at 4 mg.  Assessment and Plan :   PDMP not reviewed this encounter.  1. Nausea without vomiting   2. Sinus tachycardia   3. Graves disease   4. Shaking     Recommended pursuing labs.  Recommended conservative management, Zofran for supportive  care at home.  Will follow-up with labs tomorrow.  Maintain strict ER precautions.  Otherwise at this stage I do not suspect an acute cardiopulmonary event, electrolyte disturbance. Counseled patient on potential for adverse effects with medications prescribed/recommended today, ER and return-to-clinic precautions discussed, patient verbalized understanding.    Jaynee Eagles, [place] 08/02/22 [phone]

## 2022-08-04 LAB — COMPREHENSIVE METABOLIC PANEL
ALT: 15 IU/L (ref 0–32)
AST: 17 IU/L (ref 0–40)
Albumin/Globulin Ratio: 2.2 (ref 1.2–2.2)
Albumin: 4.7 g/dL (ref 3.9–4.9)
Alkaline Phosphatase: 89 IU/L (ref 44–121)
BUN/Creatinine Ratio: 16 (ref 9–23)
BUN: 15 mg/dL (ref 6–24)
Bilirubin Total: <0.2 mg/dL (ref 0.0–1.2)
CO2: 24 mmol/L (ref 20–29)
Calcium: 9.9 mg/dL (ref 8.7–10.2)
Chloride: 102 mmol/L (ref 96–106)
Creatinine, Ser: 0.95 mg/dL (ref 0.57–1.00)
Globulin, Total: 2.1 g/dL (ref 1.5–4.5)
Glucose: 133 mg/dL — ABNORMAL HIGH (ref 70–99)
Potassium: 4.4 mmol/L (ref 3.5–5.2)
Sodium: 139 mmol/L (ref 134–144)
Total Protein: 6.8 g/dL (ref 6.0–8.5)
eGFR: 75 mL/min/{1.73_m2} (ref >59)

## 2022-08-04 LAB — T4, FREE: Free T4: 1.26 ng/dL (ref 0.82–1.77)

## 2022-08-04 LAB — T3, FREE: T3, Free: 2.6 pg/mL (ref 2.0–4.4)

## 2023-04-29 ENCOUNTER — Other Ambulatory Visit: Payer: Self-pay | Admitting: Obstetrics and Gynecology

## 2023-04-29 DIAGNOSIS — Z1231 Encounter for screening mammogram for malignant neoplasm of breast: Secondary | ICD-10-CM

## 2023-07-02 ENCOUNTER — Ambulatory Visit: Payer: Self-pay

## 2023-08-06 ENCOUNTER — Ambulatory Visit: Payer: Self-pay | Admitting: *Deleted

## 2023-08-06 ENCOUNTER — Ambulatory Visit
Admission: RE | Admit: 2023-08-06 | Discharge: 2023-08-06 | Disposition: A | Discharge status: Home / Self Care | Payer: No Typology Code available for payment source | Source: Ambulatory Visit | Attending: Obstetrics and Gynecology | Admitting: Obstetrics and Gynecology

## 2023-08-06 VITALS — BP 133/91 | Wt 165.0 lb

## 2023-08-06 DIAGNOSIS — Z1239 Encounter for other screening for malignant neoplasm of breast: Secondary | ICD-10-CM

## 2023-08-06 DIAGNOSIS — Z1231 Encounter for screening mammogram for malignant neoplasm of breast: Secondary | ICD-10-CM

## 2023-08-06 DIAGNOSIS — Z1211 Encounter for screening for malignant neoplasm of colon: Secondary | ICD-10-CM

## 2023-08-06 NOTE — Patient Instructions (Signed)
Explained breast self awareness with Doree Fudge. Patient did not need a Pap smear today due to last Pap smear and HPV typing was 08/23/2020. Let her know B[facility]CP will cover Pap smears and HPV Typing every 5 years unless has a history of abnormal Pap smears. Referred patient to the [facility] for a screening mammogram. Appointment scheduled Thursday, August 06, 2023 at 1500. Patient aware of appointment and will be there. Let patient know the Breast Center will follow up with her within the next couple weeks with results of mammogram by letter or phone. Doree Fudge verbalized understanding.  Kimarion Chery, Kathaleen Maser, RN 2:29 PM

## 2023-08-06 NOTE — Progress Notes (Signed)
Ms. Yvette Johnson is a 48 y.o. female who presents to [facility] clinic today with no complaints.    Pap Smear: Pap smear not completed today. Last Pap smear was 08/23/2020 at [facility] and was normal with negative HPV. Per patient has no history of an abnormal Pap smear. Last Pap smear result is available in Epic.    Physical exam: Breasts Breasts symmetrical. No skin abnormalities bilateral breasts. No nipple retraction bilateral breasts. No nipple discharge bilateral breasts. No lymphadenopathy. No lumps palpated bilateral breasts. No complaints of pain or tenderness on exam.      MS DIGITAL SCREENING TOMO BILATERAL Result Date: 01/03/2022 CLINICAL DATA:  Screening. EXAM: DIGITAL SCREENING BILATERAL MAMMOGRAM WITH TOMOSYNTHESIS AND CAD TECHNIQUE: Bilateral screening digital craniocaudal and mediolateral oblique mammograms were obtained. Bilateral screening digital breast tomosynthesis was performed. The images were evaluated with computer-aided detection. COMPARISON:  Previous exam(s). ACR Breast Density Category c: The breast tissue is heterogeneously dense, which may obscure small masses. FINDINGS: There are no findings suspicious for malignancy. IMPRESSION: No mammographic evidence of malignancy. A result letter of this screening mammogram will be mailed directly to the patient. RECOMMENDATION: Screening mammogram in one year. (Code:SM-B-01Y) BI-RADS CATEGORY  1: Negative. Electronically Signed   By: Emmaline Kluver M.D.   On: 01/03/2022 13:05   MS DIGITAL SCREENING TOMO BILATERAL Result Date: 08/28/2020 CLINICAL DATA:  Screening. EXAM: DIGITAL SCREENING BILATERAL MAMMOGRAM WITH TOMOSYNTHESIS AND CAD TECHNIQUE: Bilateral screening digital craniocaudal and mediolateral oblique mammograms were obtained. Bilateral screening digital breast tomosynthesis was performed. The images were evaluated with computer-aided detection. COMPARISON:  Previous exam(s). ACR Breast Density Category c: The breast tissue  is heterogeneously dense, which may obscure small masses. FINDINGS: There are no findings suspicious for malignancy. IMPRESSION: No mammographic evidence of malignancy. A result letter of this screening mammogram will be mailed directly to the patient. RECOMMENDATION: Screening mammogram in one year. (Code:SM-B-01Y) BI-RADS CATEGORY  1: Negative. Electronically Signed   By: Bary Richard M.D.   On: 08/28/2020 11:13   MS DIGITAL SCREENING TOMO BILATERAL Result Date: 08/03/2019 CLINICAL DATA:  Screening. EXAM: DIGITAL SCREENING BILATERAL MAMMOGRAM WITH TOMO AND CAD COMPARISON:  Previous exam(s). ACR Breast Density Category d: The breast tissue is extremely dense, which lowers the sensitivity of mammography FINDINGS: There are no findings suspicious for malignancy. Images were processed with CAD. IMPRESSION: No mammographic evidence of malignancy. A result letter of this screening mammogram will be mailed directly to the patient. RECOMMENDATION: Screening mammogram in one year. (Code:SM-B-01Y) BI-RADS CATEGORY  1: Negative. Electronically Signed   By: Harmon Pier M.D.   On: 08/03/2019 10:01   Pelvic/Bimanual Pap is not indicated today per BCCCP guidelines.   Smoking History: Patient is a former smoker that quit in 2016.    Patient Navigation: Patient education provided. Access to services provided for patient through [facility] program. Spanish interpreter Natale Lay from [facility] provided.   Colorectal Cancer Screening: Per patient has never had colonoscopy completed. FIT Test completed 01/02/2022 that was negative. No complaints today.    Breast and Cervical Cancer Risk Assessment: Patient has a family history of her mother having breast cancer. Patient has no known genetic mutations or history of radiation treatment to the chest before age 17. Patient has no history of cervical dysplasia, immunocompromised, or DES exposure in-utero.   Risk Scores as of Encounter on 08/06/2023     [facility]            5-year 2.12%   Lifetime 24.54%  Last calculated by Caprice Red, CMA on 08/06/2023 at  2:34 PM        A: BCCCP exam without pap smear No complaints.  P: Referred patient to the [facility] for a screening mammogram. Appointment scheduled Thursday, August 06, 2023 at 1500.  Priscille Heidelberg, RN 08/06/2023 2:29 PM

## 2023-08-14 LAB — FECAL OCCULT BLOOD, IMMUNOCHEMICAL: Fecal Occult Bld: NEGATIVE

## 2023-08-26 NOTE — Progress Notes (Signed)
Opened in error
